# Patient Record
Sex: Male | Born: 1965 | Race: White | Hispanic: No | Marital: Married
Health system: Southern US, Community
[De-identification: ages and names within clinical notes are randomized; demographics above are authoritative.]

## PROBLEM LIST (undated history)

## (undated) DIAGNOSIS — F1910 Other psychoactive substance abuse, uncomplicated: Secondary | ICD-10-CM

## (undated) DIAGNOSIS — K766 Portal hypertension: Secondary | ICD-10-CM

## (undated) DIAGNOSIS — F1010 Alcohol abuse, uncomplicated: Secondary | ICD-10-CM

## (undated) DIAGNOSIS — K7460 Unspecified cirrhosis of liver: Secondary | ICD-10-CM

---

## 2014-06-07 ENCOUNTER — Inpatient Hospital Stay
Admission: AD | Admit: 2014-06-07 | Discharge: 2014-07-21 | Disposition: A | Discharge status: Skilled Nursing Facility | Payer: Medicare Other | Source: Ambulatory Visit | Attending: Internal Medicine | Admitting: Internal Medicine

## 2014-06-07 DIAGNOSIS — R4189 Other symptoms and signs involving cognitive functions and awareness: Secondary | ICD-10-CM

## 2014-06-07 DIAGNOSIS — J9601 Acute respiratory failure with hypoxia: Secondary | ICD-10-CM | POA: Insufficient documentation

## 2014-06-07 DIAGNOSIS — H6990 Unspecified Eustachian tube disorder, unspecified ear: Secondary | ICD-10-CM

## 2014-06-07 DIAGNOSIS — I639 Cerebral infarction, unspecified: Secondary | ICD-10-CM | POA: Insufficient documentation

## 2014-06-07 DIAGNOSIS — G929 Unspecified toxic encephalopathy: Secondary | ICD-10-CM

## 2014-06-07 DIAGNOSIS — K7460 Unspecified cirrhosis of liver: Secondary | ICD-10-CM | POA: Insufficient documentation

## 2014-06-07 DIAGNOSIS — F1910 Other psychoactive substance abuse, uncomplicated: Secondary | ICD-10-CM

## 2014-06-07 DIAGNOSIS — G92 Toxic encephalopathy: Secondary | ICD-10-CM

## 2014-06-07 DIAGNOSIS — J189 Pneumonia, unspecified organism: Secondary | ICD-10-CM

## 2014-06-07 DIAGNOSIS — J9690 Respiratory failure, unspecified, unspecified whether with hypoxia or hypercapnia: Secondary | ICD-10-CM

## 2014-06-07 DIAGNOSIS — Z95828 Presence of other vascular implants and grafts: Secondary | ICD-10-CM

## 2014-06-07 DIAGNOSIS — Z789 Other specified health status: Secondary | ICD-10-CM

## 2014-06-07 DIAGNOSIS — Z931 Gastrostomy status: Secondary | ICD-10-CM

## 2014-06-07 DIAGNOSIS — Z930 Tracheostomy status: Secondary | ICD-10-CM

## 2014-06-07 HISTORY — DX: Portal hypertension: K76.6

## 2014-06-07 HISTORY — DX: Alcohol abuse, uncomplicated: F10.10

## 2014-06-07 HISTORY — DX: Other psychoactive substance abuse, uncomplicated: F19.10

## 2014-06-07 HISTORY — DX: Unspecified cirrhosis of liver: K74.60

## 2014-06-07 LAB — CBC WITH DIFFERENTIAL/PLATELET
BASOS ABS: 0.1 10*3/uL (ref 0.0–0.1)
Basophils Relative: 1 % (ref 0–1)
Eosinophils Absolute: 0.2 10*3/uL (ref 0.0–0.7)
Eosinophils Relative: 2 % (ref 0–5)
HEMATOCRIT: 37.1 % — AB (ref 39.0–52.0)
Hemoglobin: 12.3 g/dL — ABNORMAL LOW (ref 13.0–17.0)
Lymphocytes Relative: 22 % (ref 12–46)
Lymphs Abs: 2.4 10*3/uL (ref 0.7–4.0)
MCH: 31.9 pg (ref 26.0–34.0)
MCHC: 33.2 g/dL (ref 30.0–36.0)
MCV: 96.4 fL (ref 78.0–100.0)
MONOS PCT: 14 % — AB (ref 3–12)
Monocytes Absolute: 1.5 10*3/uL — ABNORMAL HIGH (ref 0.1–1.0)
NEUTROS ABS: 6.7 10*3/uL (ref 1.7–7.7)
Neutrophils Relative %: 61 % (ref 43–77)
PLATELETS: ADEQUATE 10*3/uL (ref 150–400)
RBC: 3.85 MIL/uL — ABNORMAL LOW (ref 4.22–5.81)
RDW: 13.8 % (ref 11.5–15.5)
WBC: 10.9 10*3/uL — AB (ref 4.0–10.5)

## 2014-06-07 LAB — PHOSPHORUS: PHOSPHORUS: 2.9 mg/dL (ref 2.3–4.6)

## 2014-06-07 LAB — BASIC METABOLIC PANEL
Anion gap: 13 (ref 5–15)
BUN: 7 mg/dL (ref 6–23)
CO2: 23 mEq/L (ref 19–32)
CREATININE: 0.65 mg/dL (ref 0.50–1.35)
Calcium: 9.1 mg/dL (ref 8.4–10.5)
Chloride: 105 mEq/L (ref 96–112)
GLUCOSE: 104 mg/dL — AB (ref 70–99)
POTASSIUM: 3.4 meq/L — AB (ref 3.7–5.3)
Sodium: 141 mEq/L (ref 137–147)

## 2014-06-07 LAB — MAGNESIUM: Magnesium: 1.3 mg/dL — ABNORMAL LOW (ref 1.5–2.5)

## 2014-06-08 ENCOUNTER — Other Ambulatory Visit (HOSPITAL_COMMUNITY): Payer: Self-pay

## 2014-06-08 LAB — VITAMIN B12: VITAMIN B 12: 1069 pg/mL — AB (ref 211–911)

## 2014-06-08 LAB — COMPREHENSIVE METABOLIC PANEL
ALBUMIN: 3.2 g/dL — AB (ref 3.5–5.2)
ALK PHOS: 115 U/L (ref 39–117)
ALT: 15 U/L (ref 0–53)
AST: 34 U/L (ref 0–37)
Anion gap: 14 (ref 5–15)
BILIRUBIN TOTAL: 1.1 mg/dL (ref 0.3–1.2)
BUN: 5 mg/dL — ABNORMAL LOW (ref 6–23)
CO2: 19 mEq/L (ref 19–32)
Calcium: 8.9 mg/dL (ref 8.4–10.5)
Chloride: 104 mEq/L (ref 96–112)
Creatinine, Ser: 0.66 mg/dL (ref 0.50–1.35)
GFR calc Af Amer: >90 mL/min (ref >90)
GFR calc non Af Amer: >90 mL/min (ref >90)
Glucose, Bld: 105 mg/dL — ABNORMAL HIGH (ref 70–99)
POTASSIUM: 3.7 meq/L (ref 3.7–5.3)
Sodium: 137 mEq/L (ref 137–147)
TOTAL PROTEIN: 6.7 g/dL (ref 6.0–8.3)

## 2014-06-08 LAB — TSH: TSH: 1.66 u[IU]/mL (ref 0.350–4.500)

## 2014-06-08 LAB — APTT: aPTT: 31 seconds (ref 24–37)

## 2014-06-08 LAB — PROTIME-INR
INR: 1.19 (ref 0.00–1.49)
PROTHROMBIN TIME: 15.2 s (ref 11.6–15.2)

## 2014-06-08 LAB — GAMMA GT: GGT: 544 U/L — ABNORMAL HIGH (ref 7–51)

## 2014-06-08 LAB — T4, FREE: FREE T4: 1.39 ng/dL (ref 0.80–1.80)

## 2014-06-08 LAB — MAGNESIUM: Magnesium: 2.4 mg/dL (ref 1.5–2.5)

## 2014-06-08 LAB — PHOSPHORUS: Phosphorus: 2.4 mg/dL (ref 2.3–4.6)

## 2014-06-09 ENCOUNTER — Other Ambulatory Visit (HOSPITAL_COMMUNITY): Payer: Self-pay

## 2014-06-09 LAB — BASIC METABOLIC PANEL
ANION GAP: 14 (ref 5–15)
BUN: 6 mg/dL (ref 6–23)
CALCIUM: 8.5 mg/dL (ref 8.4–10.5)
CO2: 20 meq/L (ref 19–32)
CREATININE: 0.71 mg/dL (ref 0.50–1.35)
Chloride: 105 mEq/L (ref 96–112)
GFR calc Af Amer: >90 mL/min (ref >90)
GFR calc non Af Amer: >90 mL/min (ref >90)
Glucose, Bld: 98 mg/dL (ref 70–99)
Potassium: 3.3 mEq/L — ABNORMAL LOW (ref 3.7–5.3)
Sodium: 139 mEq/L (ref 137–147)

## 2014-06-09 LAB — CBC
HEMATOCRIT: 32.1 % — AB (ref 39.0–52.0)
Hemoglobin: 10.4 g/dL — ABNORMAL LOW (ref 13.0–17.0)
MCH: 30.7 pg (ref 26.0–34.0)
MCHC: 32.4 g/dL (ref 30.0–36.0)
MCV: 94.7 fL (ref 78.0–100.0)
PLATELETS: 215 10*3/uL (ref 150–400)
RBC: 3.39 MIL/uL — AB (ref 4.22–5.81)
RDW: 13.4 % (ref 11.5–15.5)
WBC: 8.5 10*3/uL (ref 4.0–10.5)

## 2014-06-09 LAB — PHOSPHORUS: Phosphorus: 2.4 mg/dL (ref 2.3–4.6)

## 2014-06-09 LAB — MAGNESIUM: Magnesium: 1.3 mg/dL — ABNORMAL LOW (ref 1.5–2.5)

## 2014-06-09 LAB — FOLATE RBC: RBC Folate: 838 ng/mL — ABNORMAL HIGH (ref >280)

## 2014-06-10 LAB — MAGNESIUM
MAGNESIUM: 1.5 mg/dL (ref 1.5–2.5)
Magnesium: 1.9 mg/dL (ref 1.5–2.5)

## 2014-06-10 LAB — POTASSIUM: POTASSIUM: 4.4 meq/L (ref 3.7–5.3)

## 2014-06-10 LAB — CBC
HEMATOCRIT: 28.3 % — AB (ref 39.0–52.0)
HEMOGLOBIN: 9.3 g/dL — AB (ref 13.0–17.0)
MCH: 31.1 pg (ref 26.0–34.0)
MCHC: 32.9 g/dL (ref 30.0–36.0)
MCV: 94.6 fL (ref 78.0–100.0)
Platelets: 204 10*3/uL (ref 150–400)
RBC: 2.99 MIL/uL — AB (ref 4.22–5.81)
RDW: 13.4 % (ref 11.5–15.5)
WBC: 7.9 10*3/uL (ref 4.0–10.5)

## 2014-06-10 LAB — BASIC METABOLIC PANEL
Anion gap: 15 (ref 5–15)
BUN: 3 mg/dL — AB (ref 6–23)
CHLORIDE: 103 meq/L (ref 96–112)
CO2: 19 meq/L (ref 19–32)
Calcium: 8 mg/dL — ABNORMAL LOW (ref 8.4–10.5)
Creatinine, Ser: 0.58 mg/dL (ref 0.50–1.35)
GFR calc Af Amer: >90 mL/min (ref >90)
GFR calc non Af Amer: >90 mL/min (ref >90)
Glucose, Bld: 201 mg/dL — ABNORMAL HIGH (ref 70–99)
POTASSIUM: 4.1 meq/L (ref 3.7–5.3)
SODIUM: 137 meq/L (ref 137–147)

## 2014-06-10 LAB — PHOSPHORUS: PHOSPHORUS: 2.1 mg/dL — AB (ref 2.3–4.6)

## 2014-06-11 LAB — URINALYSIS, ROUTINE W REFLEX MICROSCOPIC
BILIRUBIN URINE: NEGATIVE
Glucose, UA: 250 mg/dL — AB
Hgb urine dipstick: NEGATIVE
KETONES UR: NEGATIVE mg/dL
LEUKOCYTES UA: NEGATIVE
Nitrite: POSITIVE — AB
PH: 6.5 (ref 5.0–8.0)
Protein, ur: NEGATIVE mg/dL
SPECIFIC GRAVITY, URINE: 1.018 (ref 1.005–1.030)
UROBILINOGEN UA: 0.2 mg/dL (ref 0.0–1.0)

## 2014-06-11 LAB — BASIC METABOLIC PANEL
Anion gap: 13 (ref 5–15)
BUN: 32 mg/dL — AB (ref 6–23)
CHLORIDE: 102 meq/L (ref 96–112)
CO2: 24 mEq/L (ref 19–32)
Calcium: 8.7 mg/dL (ref 8.4–10.5)
Creatinine, Ser: 1.03 mg/dL (ref 0.50–1.35)
GFR, EST NON AFRICAN AMERICAN: 83 mL/min — AB (ref >90)
GLUCOSE: 100 mg/dL — AB (ref 70–99)
Potassium: 4.4 mEq/L (ref 3.7–5.3)
SODIUM: 139 meq/L (ref 137–147)

## 2014-06-11 LAB — CBC
HEMATOCRIT: 28 % — AB (ref 39.0–52.0)
HEMOGLOBIN: 8.9 g/dL — AB (ref 13.0–17.0)
MCH: 29 pg (ref 26.0–34.0)
MCHC: 31.8 g/dL (ref 30.0–36.0)
MCV: 91.2 fL (ref 78.0–100.0)
Platelets: 200 10*3/uL (ref 150–400)
RBC: 3.07 MIL/uL — ABNORMAL LOW (ref 4.22–5.81)
RDW: 19.2 % — ABNORMAL HIGH (ref 11.5–15.5)
WBC: 11.4 10*3/uL — AB (ref 4.0–10.5)

## 2014-06-11 LAB — MAGNESIUM: Magnesium: 1.9 mg/dL (ref 1.5–2.5)

## 2014-06-11 LAB — URINE MICROSCOPIC-ADD ON

## 2014-06-11 LAB — PHOSPHORUS: PHOSPHORUS: 3.6 mg/dL (ref 2.3–4.6)

## 2014-06-12 ENCOUNTER — Other Ambulatory Visit (HOSPITAL_COMMUNITY): Payer: Self-pay

## 2014-06-12 LAB — BASIC METABOLIC PANEL
ANION GAP: 14 (ref 5–15)
BUN: 10 mg/dL (ref 6–23)
CALCIUM: 8.7 mg/dL (ref 8.4–10.5)
CO2: 23 mEq/L (ref 19–32)
CREATININE: 0.68 mg/dL (ref 0.50–1.35)
Chloride: 105 mEq/L (ref 96–112)
Glucose, Bld: 115 mg/dL — ABNORMAL HIGH (ref 70–99)
Potassium: 3.5 mEq/L — ABNORMAL LOW (ref 3.7–5.3)
Sodium: 142 mEq/L (ref 137–147)

## 2014-06-12 LAB — CBC WITH DIFFERENTIAL/PLATELET
BASOS PCT: 1 % (ref 0–1)
Basophils Absolute: 0 10*3/uL (ref 0.0–0.1)
Eosinophils Absolute: 0.1 10*3/uL (ref 0.0–0.7)
Eosinophils Relative: 1 % (ref 0–5)
HEMATOCRIT: 34.4 % — AB (ref 39.0–52.0)
HEMOGLOBIN: 11.2 g/dL — AB (ref 13.0–17.0)
LYMPHS ABS: 0.8 10*3/uL (ref 0.7–4.0)
Lymphocytes Relative: 12 % (ref 12–46)
MCH: 31.7 pg (ref 26.0–34.0)
MCHC: 32.6 g/dL (ref 30.0–36.0)
MCV: 97.5 fL (ref 78.0–100.0)
MONO ABS: 0.6 10*3/uL (ref 0.1–1.0)
Monocytes Relative: 8 % (ref 3–12)
Neutro Abs: 5.4 10*3/uL (ref 1.7–7.7)
Neutrophils Relative %: 79 % — ABNORMAL HIGH (ref 43–77)
Platelets: 217 10*3/uL (ref 150–400)
RBC: 3.53 MIL/uL — AB (ref 4.22–5.81)
RDW: 13.5 % (ref 11.5–15.5)
WBC: 6.9 10*3/uL (ref 4.0–10.5)

## 2014-06-12 LAB — URINE CULTURE
CULTURE: NO GROWTH
Colony Count: NO GROWTH

## 2014-06-12 LAB — PHOSPHORUS: PHOSPHORUS: 2.6 mg/dL (ref 2.3–4.6)

## 2014-06-12 LAB — MAGNESIUM: Magnesium: 1.6 mg/dL (ref 1.5–2.5)

## 2014-06-13 LAB — BASIC METABOLIC PANEL
Anion gap: 14 (ref 5–15)
BUN: 12 mg/dL (ref 6–23)
CHLORIDE: 105 meq/L (ref 96–112)
CO2: 22 meq/L (ref 19–32)
Calcium: 8.7 mg/dL (ref 8.4–10.5)
Creatinine, Ser: 0.67 mg/dL (ref 0.50–1.35)
GFR calc Af Amer: >90 mL/min (ref >90)
GFR calc non Af Amer: >90 mL/min (ref >90)
GLUCOSE: 129 mg/dL — AB (ref 70–99)
POTASSIUM: 3.6 meq/L — AB (ref 3.7–5.3)
Sodium: 141 mEq/L (ref 137–147)

## 2014-06-13 LAB — PHOSPHORUS: Phosphorus: 2.7 mg/dL (ref 2.3–4.6)

## 2014-06-13 LAB — CBC
HEMATOCRIT: 32.9 % — AB (ref 39.0–52.0)
HEMOGLOBIN: 10.6 g/dL — AB (ref 13.0–17.0)
MCH: 30.7 pg (ref 26.0–34.0)
MCHC: 32.2 g/dL (ref 30.0–36.0)
MCV: 95.4 fL (ref 78.0–100.0)
Platelets: 231 10*3/uL (ref 150–400)
RBC: 3.45 MIL/uL — AB (ref 4.22–5.81)
RDW: 13.7 % (ref 11.5–15.5)
WBC: 7.2 10*3/uL (ref 4.0–10.5)

## 2014-06-13 LAB — MAGNESIUM: MAGNESIUM: 1.6 mg/dL (ref 1.5–2.5)

## 2014-06-14 ENCOUNTER — Other Ambulatory Visit (HOSPITAL_COMMUNITY): Payer: Self-pay

## 2014-06-14 LAB — CBC WITH DIFFERENTIAL/PLATELET
BASOS ABS: 0.1 10*3/uL (ref 0.0–0.1)
Basophils Relative: 1 % (ref 0–1)
EOS PCT: 0 % (ref 0–5)
Eosinophils Absolute: 0 10*3/uL (ref 0.0–0.7)
HEMATOCRIT: 34.8 % — AB (ref 39.0–52.0)
Hemoglobin: 11.1 g/dL — ABNORMAL LOW (ref 13.0–17.0)
LYMPHS ABS: 0.9 10*3/uL (ref 0.7–4.0)
LYMPHS PCT: 10 % — AB (ref 12–46)
MCH: 30.7 pg (ref 26.0–34.0)
MCHC: 31.9 g/dL (ref 30.0–36.0)
MCV: 96.4 fL (ref 78.0–100.0)
MONO ABS: 1.4 10*3/uL — AB (ref 0.1–1.0)
Monocytes Relative: 14 % — ABNORMAL HIGH (ref 3–12)
NEUTROS ABS: 7.3 10*3/uL (ref 1.7–7.7)
Neutrophils Relative %: 75 % (ref 43–77)
Platelets: 234 10*3/uL (ref 150–400)
RBC: 3.61 MIL/uL — ABNORMAL LOW (ref 4.22–5.81)
RDW: 13.8 % (ref 11.5–15.5)
WBC: 9.8 10*3/uL (ref 4.0–10.5)

## 2014-06-14 LAB — BASIC METABOLIC PANEL
Anion gap: 11 (ref 5–15)
BUN: 9 mg/dL (ref 6–23)
CHLORIDE: 105 meq/L (ref 96–112)
CO2: 24 mEq/L (ref 19–32)
Calcium: 8.8 mg/dL (ref 8.4–10.5)
Creatinine, Ser: 0.6 mg/dL (ref 0.50–1.35)
GFR calc Af Amer: >90 mL/min (ref >90)
GFR calc non Af Amer: >90 mL/min (ref >90)
GLUCOSE: 134 mg/dL — AB (ref 70–99)
POTASSIUM: 3.8 meq/L (ref 3.7–5.3)
Sodium: 140 mEq/L (ref 137–147)

## 2014-06-14 LAB — PHOSPHORUS: PHOSPHORUS: 3.8 mg/dL (ref 2.3–4.6)

## 2014-06-14 LAB — MAGNESIUM: Magnesium: 1.7 mg/dL (ref 1.5–2.5)

## 2014-06-15 ENCOUNTER — Other Ambulatory Visit: Payer: Self-pay

## 2014-06-15 ENCOUNTER — Inpatient Hospital Stay (HOSPITAL_COMMUNITY)
Admission: AD | Admit: 2014-06-15 | Discharge: 2014-06-15 | Disposition: A | Discharge status: Home / Self Care | Payer: Self-pay | Source: Ambulatory Visit | Attending: Internal Medicine | Admitting: Internal Medicine

## 2014-06-15 ENCOUNTER — Other Ambulatory Visit (HOSPITAL_COMMUNITY): Payer: Self-pay

## 2014-06-15 ENCOUNTER — Encounter: Payer: Self-pay | Admitting: Pulmonary Disease

## 2014-06-15 DIAGNOSIS — J9600 Acute respiratory failure, unspecified whether with hypoxia or hypercapnia: Secondary | ICD-10-CM | POA: Diagnosis not present

## 2014-06-15 DIAGNOSIS — R40243 Glasgow coma scale score 3-8: Secondary | ICD-10-CM | POA: Diagnosis not present

## 2014-06-15 DIAGNOSIS — I469 Cardiac arrest, cause unspecified: Secondary | ICD-10-CM

## 2014-06-15 LAB — COMPREHENSIVE METABOLIC PANEL
ALT: 17 U/L (ref 0–53)
ANION GAP: 13 (ref 5–15)
AST: 44 U/L — ABNORMAL HIGH (ref 0–37)
Albumin: 2.6 g/dL — ABNORMAL LOW (ref 3.5–5.2)
Alkaline Phosphatase: 205 U/L — ABNORMAL HIGH (ref 39–117)
BUN: 23 mg/dL (ref 6–23)
CALCIUM: 8.6 mg/dL (ref 8.4–10.5)
CO2: 24 mEq/L (ref 19–32)
CREATININE: 0.86 mg/dL (ref 0.50–1.35)
Chloride: 100 mEq/L (ref 96–112)
GLUCOSE: 130 mg/dL — AB (ref 70–99)
Potassium: 3.7 mEq/L (ref 3.7–5.3)
Sodium: 137 mEq/L (ref 137–147)
TOTAL PROTEIN: 6.4 g/dL (ref 6.0–8.3)
Total Bilirubin: 0.5 mg/dL (ref 0.3–1.2)

## 2014-06-15 LAB — BLOOD GAS, ARTERIAL
Acid-base deficit: 3.6 mmol/L — ABNORMAL HIGH (ref 0.0–2.0)
Bicarbonate: 21.9 mEq/L (ref 20.0–24.0)
FIO2: 1 %
O2 Saturation: 98.3 %
PCO2 ART: 47 mmHg — AB (ref 35.0–45.0)
PEEP: 5 cmH2O
PH ART: 7.291 — AB (ref 7.350–7.450)
PO2 ART: 126 mmHg — AB (ref 80.0–100.0)
Patient temperature: 99.1
RATE: 14 resp/min
TCO2: 23.3 mmol/L (ref 0–100)
VT: 500 mL

## 2014-06-15 LAB — CBC WITH DIFFERENTIAL/PLATELET
Basophils Absolute: 0 10*3/uL (ref 0.0–0.1)
Basophils Relative: 0 % (ref 0–1)
EOS ABS: 0 10*3/uL (ref 0.0–0.7)
EOS PCT: 0 % (ref 0–5)
HCT: 33.7 % — ABNORMAL LOW (ref 39.0–52.0)
Hemoglobin: 10.9 g/dL — ABNORMAL LOW (ref 13.0–17.0)
Lymphocytes Relative: 8 % — ABNORMAL LOW (ref 12–46)
Lymphs Abs: 0.8 10*3/uL (ref 0.7–4.0)
MCH: 31.7 pg (ref 26.0–34.0)
MCHC: 32.3 g/dL (ref 30.0–36.0)
MCV: 98 fL (ref 78.0–100.0)
MONOS PCT: 12 % (ref 3–12)
Monocytes Absolute: 1.2 10*3/uL — ABNORMAL HIGH (ref 0.1–1.0)
Neutro Abs: 8.1 10*3/uL — ABNORMAL HIGH (ref 1.7–7.7)
Neutrophils Relative %: 80 % — ABNORMAL HIGH (ref 43–77)
Platelets: 184 10*3/uL (ref 150–400)
RBC: 3.44 MIL/uL — ABNORMAL LOW (ref 4.22–5.81)
RDW: 13.6 % (ref 11.5–15.5)
WBC: 10.1 10*3/uL (ref 4.0–10.5)

## 2014-06-15 LAB — PHOSPHORUS: PHOSPHORUS: 3 mg/dL (ref 2.3–4.6)

## 2014-06-15 LAB — MAGNESIUM: MAGNESIUM: 1.9 mg/dL (ref 1.5–2.5)

## 2014-06-15 LAB — TROPONIN I: Troponin I: <0.3 ng/mL (ref <0.30)

## 2014-06-15 MED FILL — Medication: Qty: 1 | Status: AC

## 2014-06-15 NOTE — Consult Note (Signed)
Name: Paul Scott MRN: [id number] DOB: 03/06/1966    ADMISSION DATE:  06/07/2014 CONSULTATION DATE:  06/15/14  REFERRING MD :  Dr. Sharyon MedicusHijazi   CHIEF COMPLAINT:  Cardiopulmonary arrest  BRIEF PATIENT DESCRIPTION: 48 y/o M with PMH of polysubstance abuse   SIGNIFICANT EVENTS  11/23  [facility] with [facility], intubated 12/02  [facility], on phenobarbitol  12/10  Bradycardic arrest, intubated   STUDIES:  12/10  CT Head >>     HISTORY OF PRESENT ILLNESS: 48 y/o M with PMH of polysubstance abuse [facility] after being found unresponsive in his yard.  He suffered a PEA arrest in the ER and was admitted to the ICU.  Arrest was thought to be related to substance abuse.  Hospital work up at that time was positive for cirrhosis with portal hypertension and ascites.  He underwent paracentesis with 4L of fluid removed.  The patient was extubated and moved to SDU where he developed DT's and severe encephalopathy.  He was treated with IV benzo's which did not improve patient symptoms.  He was evaluated by psychiatry and placed on phenobarbitol.  Hospital course complicated by tachycardia, hypertension, and c-diff colitis (dx on 06/01/14.  He was treated with Flagyl (planned stop date 06/15/14).  Per notes, the patient was awake, alert and able to take pills.  [facility] [place] [facility].  On 12/10, the patient suffered an bradycardic arrest and was intubated.  PCCM consulted for evaluation.  [facility], the patient has not been awake / oriented since admission.     PAST MEDICAL HISTORY :   has a past medical history of Alcohol abuse; Drug abuse; Cirrhosis; and Portal hypertension.  has no past surgical history on file.    PRIOR MEDICATIONS: Vancomycin oral  Flagyl  Metoprolol Duoneb Ativan 10 mg Q2 PRN seizure Levaquin  Clonidine Folic Acid  Haldol  Hydralazine  Thiamine  Sucralfate Pepcid    No Known  Allergies  FAMILY HISTORY:  family history is not on file.   SOCIAL HISTORY: unable to complete as patient is altered on vent  REVIEW OF SYSTEMS:  Unable to complete as patient is altered on vent.    SUBJECTIVE:   VITAL SIGNS:  Reviewed, pertinent positives will be reviewed in A/P   PHYSICAL EXAMINATION: General:  Thin adult male in NAD Neuro:  Obtunded on vent  HEENT:  OETT, thick oral secretions  Cardiovascular:  s1s2 rrr, no m/r/g  Lungs:  resp's even/non-labored, lungs bilaterally diminished  Abdomen:  Round/soft, bsx4 active  Musculoskeletal:  No acute deformities  Skin:  Warm/dry, multiple tattoos   Recent Labs Lab 06/12/14 0500 06/13/14 0635 06/14/14 0545  NA 142 141 140  K 3.5* 3.6* 3.8  CL 105 105 105  CO2 23 22 24   BUN 10 12 9   CREATININE 0.68 0.67 0.60  GLUCOSE 115* 129* 134*    Recent Labs Lab 06/12/14 0745 06/13/14 0635 06/14/14 0545  HGB 11.2* 10.6* 11.1*  HCT 34.4* 32.9* 34.8*  WBC 6.9 7.2 9.8  PLT 217 231 234   Dg Chest Port 1 View  06/15/2014   CLINICAL DATA:  Code blue. Endotracheal tube placement. Unresponsive.  EXAM: PORTABLE CHEST - 1 VIEW  COMPARISON:  06/14/2014  FINDINGS: Endotracheal tube tip measures 6.6 cm above the carinal. Enteric tube tip is off the field of view but below the left hemidiaphragm right central venous catheter tip over  the lower SVC region. No pneumothorax. Shallow inspiration. Heart size and pulmonary vascularity are normal. No focal airspace disease or consolidation in the lungs. No pneumothorax. Probable fracture of the anterior right second rib.  IMPRESSION: Appliances appear in satisfactory position. Lungs are clear. Probable right second anterior rib fracture.   Electronically Signed   By: Burman NievesWilliam  Stevens M.D.   On: 06/15/2014 06:56   Dg Chest Port 1 View  06/14/2014   CLINICAL DATA:  History pneumonia  EXAM: PORTABLE CHEST - 1 VIEW  COMPARISON:  Chest x-ray of 06/12/2014  FINDINGS: There is mild atelectasis  in the lung bases but no definite infiltrate or pleural effusion is seen. Linear atelectasis is noted more prominently at the right lung base. A right central venous line tip overlies the expected SVC -RA junction. An NG tube is present extending into the stomach. The heart size is within normal limits.  IMPRESSION: Bibasilar atelectasis.  No definite pneumonia or effusion.   Electronically Signed   By: Dwyane DeePaul  Barry M.D.   On: 06/14/2014 14:34    ASSESSMENT / PLAN:   Respiratory Arrest - bradycardia then arrest on 12/10, required intubation  R 2nd Rib Fracture - post CPR  Polysubstance Abuse  Encephalopathy C-Diff Colitis  Cirrhosis with Portal Hypertension & Ascites   Plan: Duoneb Q6 + Q3 PRN albuterol  IMV support, adjust rate, follow up ABG in am  Sputum culture  ABX per primary  CT head to evaluate for acute process  Recommend slow reduction / taper of sedating medications to allow for neuro assessment    Canary BrimBrandi Ollis, NP-C [facility] Pgr: [phone] or [phone]  Recommend discontinuation of all sedating medications except for 1/2 of the phenobarb, start propofol, if mental status remains poor by AM then will [facility] neuro to evaluate.  In the meantime, check EEG and CT of the head.  Low threshold for transfer.  In the meantime, support vital signs and will reevaluate again in AM.  The patient is critically ill with multiple organ systems failure and requires high complexity decision making for assessment and support, frequent evaluation and titration of therapies, application of advanced monitoring technologies and extensive interpretation of multiple databases.   Critical Care Time devoted to patient care services described in this note is  35  Minutes. This time reflects time of care of this signee Dr Koren BoundWesam Saavi Mceachron. This critical care time does not reflect procedure time, or teaching time or supervisory time of PA/NP/Med student/Med  Resident etc but could involve care discussion time.  Alyson ReedyWesam G. Lerline Valdivia, M.D. [facility]. Pager: [phone]. After hours pager: [phone].  06/15/2014, 1:04 PM

## 2014-06-15 NOTE — Progress Notes (Signed)
EEG completed; results pending.    

## 2014-06-15 NOTE — Procedures (Signed)
EEG report.  Brief clinical history:  48 y/o M with PMH of polysubstance abuse [facility] after being found unresponsive in his yard. He suffered a PEA arrest in the ER and was admitted to the ICU. Arrest was thought to be related to substance abuse. On 12/10, the patient suffered an bradycardic arrest and was intubated. PCCM consulted for evaluation. [facility], the patient has not been awake / oriented since admission.   Technique: this is a 17 channel routine scalp EEG performed at the bedside with bipolar and monopolar montages arranged in accordance to the international 10/20 system of electrode placement. One channel was dedicated to EKG recording.  Patient is intubated on the vent. No activating procedures performed.  Description: as the study begins and throughout the entire recording, there is evidence of continuous and generalized alpha rhythm that doesn't appears to be posterior dominant is unreactive. No focal or generalized epileptiform discharges noted.  EKG showed sinus rhythm.   Impression: this is an abnormal EEG because of the above described findings. The overall EEG pattern seems to be consistent with alpha coma, which can be seen in a variety of conditions including post anoxic brain injury. Although typically associated with poor prognosis, it can be reversible particularly if medication or drug related. Clinical correlation is advised.   Wyatt Portelasvaldo Camilo, MD [facility]

## 2014-06-16 ENCOUNTER — Other Ambulatory Visit (HOSPITAL_COMMUNITY): Payer: Self-pay

## 2014-06-16 DIAGNOSIS — J9621 Acute and chronic respiratory failure with hypoxia: Secondary | ICD-10-CM

## 2014-06-16 DIAGNOSIS — R40244 Other coma, without documented Glasgow coma scale score, or with partial score reported: Secondary | ICD-10-CM | POA: Diagnosis not present

## 2014-06-16 DIAGNOSIS — I469 Cardiac arrest, cause unspecified: Secondary | ICD-10-CM | POA: Diagnosis not present

## 2014-06-16 LAB — CBC
HCT: 32.6 % — ABNORMAL LOW (ref 39.0–52.0)
Hemoglobin: 10.4 g/dL — ABNORMAL LOW (ref 13.0–17.0)
MCH: 31.4 pg (ref 26.0–34.0)
MCHC: 31.9 g/dL (ref 30.0–36.0)
MCV: 98.5 fL (ref 78.0–100.0)
Platelets: 181 10*3/uL (ref 150–400)
RBC: 3.31 MIL/uL — ABNORMAL LOW (ref 4.22–5.81)
RDW: 13.7 % (ref 11.5–15.5)
WBC: 8.9 10*3/uL (ref 4.0–10.5)

## 2014-06-16 LAB — BLOOD GAS, ARTERIAL
Acid-Base Excess: 1.9 mmol/L (ref 0.0–2.0)
BICARBONATE: 25 meq/L — AB (ref 20.0–24.0)
FIO2: 0.4 %
MECHVT: 500 mL
O2 Saturation: 98.3 %
PCO2 ART: 32.7 mmHg — AB (ref 35.0–45.0)
PEEP: 5 cmH2O
PH ART: 7.496 — AB (ref 7.350–7.450)
Patient temperature: 98.6
RATE: 14 resp/min
TCO2: 26 mmol/L (ref 0–100)
pO2, Arterial: 114 mmHg — ABNORMAL HIGH (ref 80.0–100.0)

## 2014-06-16 LAB — BASIC METABOLIC PANEL
Anion gap: 12 (ref 5–15)
BUN: 24 mg/dL — ABNORMAL HIGH (ref 6–23)
CO2: 25 mEq/L (ref 19–32)
Calcium: 8.7 mg/dL (ref 8.4–10.5)
Chloride: 104 mEq/L (ref 96–112)
Creatinine, Ser: 0.76 mg/dL (ref 0.50–1.35)
GFR calc Af Amer: >90 mL/min (ref >90)
Glucose, Bld: 114 mg/dL — ABNORMAL HIGH (ref 70–99)
Potassium: 4.1 mEq/L (ref 3.7–5.3)
Sodium: 141 mEq/L (ref 137–147)

## 2014-06-16 LAB — MAGNESIUM: MAGNESIUM: 2.1 mg/dL (ref 1.5–2.5)

## 2014-06-16 LAB — LACTIC ACID, PLASMA: LACTIC ACID, VENOUS: 2.1 mmol/L (ref 0.5–2.2)

## 2014-06-16 NOTE — Progress Notes (Signed)
Name: Paul Scott MRN: [id number] DOB: 03/14/1966    ADMISSION DATE:  06/07/2014 CONSULTATION DATE:  06/15/14  REFERRING MD :  Dr. Sharyon MedicusHijazi   CHIEF COMPLAINT:  Cardiopulmonary arrest  BRIEF PATIENT DESCRIPTION: 48 y/o M with PMH of polysubstance abuse   SIGNIFICANT EVENTS  11/23  [facility], intubated 12/02  [facility], on phenobarbitol  12/10  Bradycardic arrest, intubated   STUDIES:  12/10  CT Head >> neg    HISTORY OF PRESENT ILLNESS: 48 y/o M with PMH of polysubstance abuse previously admitted to [facility] 05/29/14 after being found unresponsive in his yard.  He suffered a PEA arrest in the ER and was admitted to the ICU.  Arrest was thought to be related to substance abuse.  Hospital work up at that time was positive for cirrhosis with portal hypertension and ascites.  He underwent paracentesis with 4L of fluid removed.  The patient was extubated and moved to SDU where he developed DT's and severe encephalopathy.  He was treated with IV benzo's which did not improve patient symptoms.  He was evaluated by psychiatry and placed on phenobarbitol.  Hospital course complicated by tachycardia, hypertension, and c-diff colitis (dx on 06/01/14.  He was treated with Flagyl (planned stop date 06/15/14).  Per notes, the patient was awake, alert and able to take pills.  [facility] [place] [facility].  On 12/10, the patient suffered an bradycardic arrest and was intubated.  PCCM consulted for evaluation.  Per [facility] report, the patient has not been awake / oriented since admission.        SUBJECTIVE:   VITAL SIGNS:  Reviewed, pertinent positives will be reviewed in A/P   PHYSICAL EXAMINATION: General:  Thin adult male in NAD on vent Neuro: follows commandson vent  HEENT:  OETT, thick oral secretions  Cardiovascular:  s1s2 rrr, no m/r/g  Lungs:  resp's even/non-labored, lungs bilaterally diminished  Abdomen:  Round/soft, bsx4 active   Musculoskeletal:  No acute deformities  Skin:  Warm/dry, multiple tattoos   Recent Labs Lab 06/14/14 0545 06/15/14 1110 06/16/14 0500  NA 140 137 141  K 3.8 3.7 4.1  CL 105 100 104  CO2 24 24 25   BUN 9 23 24*  CREATININE 0.60 0.86 0.76  GLUCOSE 134* 130* 114*    Recent Labs Lab 06/14/14 0545 06/15/14 1110 06/16/14 0500  HGB 11.1* 10.9* 10.4*  HCT 34.8* 33.7* 32.6*  WBC 9.8 10.1 8.9  PLT 234 184 181   Ct Head Wo Contrast  06/16/2014   CLINICAL DATA:  Cardiac arrest today.  Patient is intubated.  EXAM: CT HEAD WITHOUT CONTRAST  TECHNIQUE: Contiguous axial images were obtained from the base of the skull through the vertex without intravenous contrast.  COMPARISON:  06/05/2014  FINDINGS: Diffuse cerebral atrophy. Low-attenuation changes in the deep white matter consistent with small vessel ischemia. Mild ventricular dilatation consistent with some central atrophy. No mass effect or midline shift. No abnormal extra-axial fluid collections. Gray-white matter junctions are distinct. Basal cisterns are not effaced. No evidence of acute intracranial hemorrhage. No depressed skull fractures. Opacification of the left maxillary antrum. Mucous or retention cysts in the sphenoid sinus and left maxillary antrum. Right nasal catheter and endotracheal tubes are demonstrated. Mastoid air cells are not opacified.  IMPRESSION: No acute intracranial abnormalities. Chronic atrophy and small vessel ischemic changes. Inflammatory changes suggested in the paranasal sinuses.   Electronically Signed   By:  Burman NievesWilliam  Stevens M.D.   On: 06/16/2014 01:39   Dg Chest Port 1 View  06/16/2014   CLINICAL DATA:  Respiratory failure.  EXAM: PORTABLE CHEST - 1 VIEW  COMPARISON:  06/15/2014 .  FINDINGS: Endotracheal tube, NG tube, right IJ line in stable position. The stomach hilar structures normal. Lungs are clear. No pleural effusion or pneumothorax. Right anterior second rib fracture.  IMPRESSION: 1. Lines and tubes  in stable position. 2. No acute cardiopulmonary disease. 3. Right anterior second rib fracture.  No pneumothorax.   Electronically Signed   By: Maisie Fushomas  Register   On: 06/16/2014 08:12   Dg Chest Port 1 View  06/15/2014   CLINICAL DATA:  Code blue. Endotracheal tube placement. Unresponsive.  EXAM: PORTABLE CHEST - 1 VIEW  COMPARISON:  06/14/2014  FINDINGS: Endotracheal tube tip measures 6.6 cm above the carinal. Enteric tube tip is off the field of view but below the left hemidiaphragm right central venous catheter tip over the lower SVC region. No pneumothorax. Shallow inspiration. Heart size and pulmonary vascularity are normal. No focal airspace disease or consolidation in the lungs. No pneumothorax. Probable fracture of the anterior right second rib.  IMPRESSION: Appliances appear in satisfactory position. Lungs are clear. Probable right second anterior rib fracture.   Electronically Signed   By: Burman NievesWilliam  Stevens M.D.   On: 06/15/2014 06:56   Dg Chest Port 1 View  06/14/2014   CLINICAL DATA:  History pneumonia  EXAM: PORTABLE CHEST - 1 VIEW  COMPARISON:  Chest x-ray of 06/12/2014  FINDINGS: There is mild atelectasis in the lung bases but no definite infiltrate or pleural effusion is seen. Linear atelectasis is noted more prominently at the right lung base. A right central venous line tip overlies the expected SVC -RA junction. An NG tube is present extending into the stomach. The heart size is within normal limits.  IMPRESSION: Bibasilar atelectasis.  No definite pneumonia or effusion.   Electronically Signed   By: Dwyane DeePaul  Barry M.D.   On: 06/14/2014 14:34    ASSESSMENT / PLAN:   Respiratory Arrest - bradycardia then arrest on 12/10, required intubation  R 2nd Rib Fracture - post CPR  Polysubstance Abuse  Encephalopathy C-Diff Colitis  Cirrhosis with Portal Hypertension & Ascites   Plan: Duoneb Q6 + Q3 PRN albuterol  IMV support, adjust rate,   Sputum culture  ABX per primary  CT head to  evaluate for acute process negative 12/10 Follows commands but with copious secretions. No extubation today, consider trach since he has had multiple intubations.  Brett CanalesSteve Minor ACNP [facility] Pager [phone] till 3 pm If no answer page [phone] 06/16/2014, 11:24 AM  Also recommend aggressive diureses inorder to address copious secretion.  If family and patient continue to wish for full support then would recommend trach given multiple intubations in a short period of time.  In the meantime, continue aggressive weaning.  The hope is once trached will liberate from the ventilator rather quickly.  Patient seen and examined, agree with above note.  I dictated the care and orders written for this patient under my direction.  Alyson ReedyWesam G Nyal Schachter, MD [phone]

## 2014-06-17 LAB — CBC
HCT: 30.3 % — ABNORMAL LOW (ref 39.0–52.0)
Hemoglobin: 9.5 g/dL — ABNORMAL LOW (ref 13.0–17.0)
MCH: 30.5 pg (ref 26.0–34.0)
MCHC: 31.4 g/dL (ref 30.0–36.0)
MCV: 97.4 fL (ref 78.0–100.0)
Platelets: 162 10*3/uL (ref 150–400)
RBC: 3.11 MIL/uL — AB (ref 4.22–5.81)
RDW: 13.8 % (ref 11.5–15.5)
WBC: 6.1 10*3/uL (ref 4.0–10.5)

## 2014-06-17 LAB — BASIC METABOLIC PANEL
ANION GAP: 14 (ref 5–15)
BUN: 20 mg/dL (ref 6–23)
CO2: 23 mEq/L (ref 19–32)
Calcium: 8.8 mg/dL (ref 8.4–10.5)
Chloride: 104 mEq/L (ref 96–112)
Creatinine, Ser: 0.69 mg/dL (ref 0.50–1.35)
GFR calc Af Amer: >90 mL/min (ref >90)
Glucose, Bld: 104 mg/dL — ABNORMAL HIGH (ref 70–99)
POTASSIUM: 4.4 meq/L (ref 3.7–5.3)
SODIUM: 141 meq/L (ref 137–147)

## 2014-06-18 LAB — BASIC METABOLIC PANEL
Anion gap: 14 (ref 5–15)
BUN: 14 mg/dL (ref 6–23)
CHLORIDE: 102 meq/L (ref 96–112)
CO2: 23 meq/L (ref 19–32)
Calcium: 8.8 mg/dL (ref 8.4–10.5)
Creatinine, Ser: 0.54 mg/dL (ref 0.50–1.35)
GFR calc non Af Amer: >90 mL/min (ref >90)
Glucose, Bld: 130 mg/dL — ABNORMAL HIGH (ref 70–99)
POTASSIUM: 3.8 meq/L (ref 3.7–5.3)
Sodium: 139 mEq/L (ref 137–147)

## 2014-06-18 LAB — CBC
HCT: 31.9 % — ABNORMAL LOW (ref 39.0–52.0)
Hemoglobin: 9.8 g/dL — ABNORMAL LOW (ref 13.0–17.0)
MCH: 29.7 pg (ref 26.0–34.0)
MCHC: 30.7 g/dL (ref 30.0–36.0)
MCV: 96.7 fL (ref 78.0–100.0)
PLATELETS: 157 10*3/uL (ref 150–400)
RBC: 3.3 MIL/uL — ABNORMAL LOW (ref 4.22–5.81)
RDW: 13.3 % (ref 11.5–15.5)
WBC: 5.7 10*3/uL (ref 4.0–10.5)

## 2014-06-19 ENCOUNTER — Encounter: Payer: Self-pay | Admitting: Certified Registered"

## 2014-06-19 ENCOUNTER — Other Ambulatory Visit (HOSPITAL_COMMUNITY): Payer: Self-pay

## 2014-06-19 DIAGNOSIS — J9601 Acute respiratory failure with hypoxia: Secondary | ICD-10-CM | POA: Diagnosis not present

## 2014-06-19 DIAGNOSIS — K7031 Alcoholic cirrhosis of liver with ascites: Secondary | ICD-10-CM | POA: Diagnosis not present

## 2014-06-19 DIAGNOSIS — J9600 Acute respiratory failure, unspecified whether with hypoxia or hypercapnia: Secondary | ICD-10-CM | POA: Diagnosis not present

## 2014-06-19 DIAGNOSIS — K7460 Unspecified cirrhosis of liver: Secondary | ICD-10-CM | POA: Insufficient documentation

## 2014-06-19 LAB — BLOOD GAS, ARTERIAL
ACID-BASE EXCESS: 4.2 mmol/L — AB (ref 0.0–2.0)
ACID-BASE EXCESS: 3.1 mmol/L — AB (ref 0.0–2.0)
BICARBONATE: 27.3 meq/L — AB (ref 20.0–24.0)
Bicarbonate: 26.3 mEq/L — ABNORMAL HIGH (ref 20.0–24.0)
FIO2: 0.35 %
FIO2: 0.6 %
LHR: 14 {breaths}/min
MECHVT: 500 mL
O2 SAT: 99.2 %
O2 SAT: 99.2 %
PATIENT TEMPERATURE: 98.1
PEEP/CPAP: 5 cmH2O
PEEP/CPAP: 5 cmH2O
Patient temperature: 98.6
Pressure support: 5 cmH2O
TCO2: 27.4 mmol/L (ref 0–100)
TCO2: 28.3 mmol/L (ref 0–100)
pCO2 arterial: 33.7 mmHg — ABNORMAL LOW (ref 35.0–45.0)
pCO2 arterial: 35 mmHg (ref 35.0–45.0)
pH, Arterial: 7.489 — ABNORMAL HIGH (ref 7.350–7.450)
pH, Arterial: 7.517 — ABNORMAL HIGH (ref 7.350–7.450)
pO2, Arterial: 139 mmHg — ABNORMAL HIGH (ref 80.0–100.0)
pO2, Arterial: 186 mmHg — ABNORMAL HIGH (ref 80.0–100.0)

## 2014-06-19 MED ORDER — SUCCINYLCHOLINE CHLORIDE 20 MG/ML IJ SOLN
INTRAMUSCULAR | Status: DC | PRN
  Administered 2014-06-19: 100 mg via INTRAVENOUS

## 2014-06-19 MED ORDER — PROPOFOL 10 MG/ML IV BOLUS
INTRAVENOUS | Status: DC | PRN
  Administered 2014-06-19: 140 mg via INTRAVENOUS

## 2014-06-19 NOTE — Progress Notes (Signed)
[facility] PCCM Seen and examined, detailed hand written [facility] CC time 30 minutes Heber CarolinaBrent McQuaid, MD [place] [facility]M Pager: [phone] Cell: [phone] If no response, call [phone]

## 2014-06-19 NOTE — Progress Notes (Signed)
Name: Paul Scott MRN: [id number] DOB: 01/04/1966    ADMISSION DATE:  06/07/2014 CONSULTATION DATE:  06/15/14  REFERRING MD :  Dr. Sharyon MedicusHijazi   CHIEF COMPLAINT:  Cardiopulmonary arrest  BRIEF PATIENT DESCRIPTION: 48 y/o M with PMH of polysubstance abuse   SIGNIFICANT EVENTS  11/23  [facility], intubated 12/02  [facility] on phenobarbitol  12/10  Bradycardic arrest, intubated  12/14 planned extubated @ 12 n.    STUDIES:  12/10  CT Head >> neg    HISTORY OF PRESENT ILLNESS: 48 y/o M with PMH of polysubstance abuse [facility] after being found unresponsive in his yard.  He suffered a PEA arrest in the ER and was admitted to the ICU.  Arrest was thought to be related to substance abuse.  Hospital work up at that time was positive for cirrhosis with portal hypertension and ascites.  He underwent paracentesis with 4L of fluid removed.  The patient was extubated and moved to SDU where he developed DT's and severe encephalopathy.  He was treated with IV benzo's which did not improve patient symptoms.  He was evaluated by psychiatry and placed on phenobarbitol.  Hospital course complicated by tachycardia, hypertension, and c-diff colitis (dx on 06/01/14.  He was treated with Flagyl (planned stop date 06/15/14).  Per notes, the patient was awake, alert and able to take pills.  [facility] [place] [facility].  On 12/10, the patient suffered an bradycardic arrest and was intubated.  PCCM consulted for evaluation.  [facility], the patient has not been awake / oriented since admission.        SUBJECTIVE:   VITAL SIGNS:  Vital signs reviewed. Abnormal values will appear under impression plan section.     PHYSICAL EXAMINATION: General:  Thin adult male in NAD on vent Neuro: follows commands on vent but sleepy HEENT:  OETT, thick oral secretions  Cardiovascular:  s1s2 rrr, no m/r/g  Lungs:  resp's  even/non-labored, lungs bilaterally diminished  Abdomen:  Round/soft, bsx4 active  Musculoskeletal:  No acute deformities  Skin:  Warm/dry, multiple tattoos   Recent Labs Lab 06/16/14 0500 06/17/14 0502 06/18/14 0703  NA 141 141 139  K 4.1 4.4 3.8  CL 104 104 102  CO2 25 23 23   BUN 24* 20 14  CREATININE 0.76 0.69 0.54  GLUCOSE 114* 104* 130*    Recent Labs Lab 06/16/14 0500 06/17/14 0502 06/18/14 0703  HGB 10.4* 9.5* 9.8*  HCT 32.6* 30.3* 31.9*  WBC 8.9 6.1 5.7  PLT 181 162 157   No results found.  ASSESSMENT / PLAN:   Respiratory Arrest - bradycardia then arrest on 12/10, required intubation  R 2nd Rib Fracture - post CPR  Polysubstance Abuse  Encephalopathy on phenobarb Szs post code with negative head CT C-Diff Colitis  Cirrhosis with Portal Hypertension & Ascites   Plan: Duoneb Q6 + Q3 PRN albuterol   Sputum culture  ABX per primary  .12/14 in PS 5/5 cpap rr 18 Vt 430 .4/ sats 97% Plan to hold all sedation and attempt extubation at 12 n 12/14. If re intubated then proceed with early trach.  Brett CanalesSteve Minor ACNP [facility] Pager [phone] till 3 pm If no answer page [phone] 06/19/2014, 10:14 AM  PCCM ATTENDING: Pt seen on work rounds with care provider noted above. We reviewed pt's initial presentation, consultants notes and hospital database in detail.  The  above assessment and plan was formulated under my direction.  Pt extubated earlier today. He is somnolent with poor cough reflex and upper airway secretions. Although he does not require re-intubation presently, he is at high risk for such. Rec:  1) hold TFs in anticipation of possible re-intubation 2) DC all sedating medications 3) NTS PRN 4) follow up CXR for 12/15 ordered 5) please call if he deteriorates  Billy Fischeravid Simonds, MD;  PCCM service; Mobile [phone]

## 2014-06-19 NOTE — Transfer of Care (Signed)
OOD intubation 

## 2014-06-20 ENCOUNTER — Other Ambulatory Visit (HOSPITAL_COMMUNITY): Payer: Self-pay

## 2014-06-20 LAB — CULTURE, BLOOD (ROUTINE X 2)
Culture: NO GROWTH
Culture: NO GROWTH

## 2014-06-20 LAB — CBC WITH DIFFERENTIAL/PLATELET
Basophils Absolute: 0 10*3/uL (ref 0.0–0.1)
Basophils Relative: 1 % (ref 0–1)
EOS PCT: 2 % (ref 0–5)
Eosinophils Absolute: 0.1 10*3/uL (ref 0.0–0.7)
HCT: 29.6 % — ABNORMAL LOW (ref 39.0–52.0)
Hemoglobin: 9.5 g/dL — ABNORMAL LOW (ref 13.0–17.0)
LYMPHS ABS: 1 10*3/uL (ref 0.7–4.0)
Lymphocytes Relative: 12 % (ref 12–46)
MCH: 30.6 pg (ref 26.0–34.0)
MCHC: 32.1 g/dL (ref 30.0–36.0)
MCV: 95.5 fL (ref 78.0–100.0)
MONOS PCT: 8 % (ref 3–12)
Monocytes Absolute: 0.6 10*3/uL (ref 0.1–1.0)
Neutro Abs: 6.4 10*3/uL (ref 1.7–7.7)
Neutrophils Relative %: 77 % (ref 43–77)
Platelets: 181 10*3/uL (ref 150–400)
RBC: 3.1 MIL/uL — ABNORMAL LOW (ref 4.22–5.81)
RDW: 13.5 % (ref 11.5–15.5)
WBC: 8.2 10*3/uL (ref 4.0–10.5)

## 2014-06-20 LAB — BLOOD GAS, ARTERIAL
ACID-BASE EXCESS: 4 mmol/L — AB (ref 0.0–2.0)
BICARBONATE: 27.2 meq/L — AB (ref 20.0–24.0)
FIO2: 0.4 %
MECHVT: 500 mL
O2 Saturation: 99.2 %
PCO2 ART: 36.3 mmHg (ref 35.0–45.0)
PEEP: 5 cmH2O
PO2 ART: 150 mmHg — AB (ref 80.0–100.0)
Patient temperature: 100
RATE: 14 resp/min
TCO2: 28.2 mmol/L (ref 0–100)
pH, Arterial: 7.49 — ABNORMAL HIGH (ref 7.350–7.450)

## 2014-06-20 LAB — COMPREHENSIVE METABOLIC PANEL
ALT: 27 U/L (ref 0–53)
AST: 34 U/L (ref 0–37)
Albumin: 2.4 g/dL — ABNORMAL LOW (ref 3.5–5.2)
Alkaline Phosphatase: 218 U/L — ABNORMAL HIGH (ref 39–117)
Anion gap: 20 — ABNORMAL HIGH (ref 5–15)
BILIRUBIN TOTAL: 0.5 mg/dL (ref 0.3–1.2)
BUN: 15 mg/dL (ref 6–23)
CALCIUM: 7.8 mg/dL — AB (ref 8.4–10.5)
CO2: 24 meq/L (ref 19–32)
CREATININE: 0.57 mg/dL (ref 0.50–1.35)
Chloride: 104 mEq/L (ref 96–112)
GFR calc Af Amer: >90 mL/min (ref >90)
GLUCOSE: 84 mg/dL (ref 70–99)
Potassium: 3.2 mEq/L — ABNORMAL LOW (ref 3.7–5.3)
Sodium: 148 mEq/L — ABNORMAL HIGH (ref 137–147)
Total Protein: 6.5 g/dL (ref 6.0–8.3)

## 2014-06-20 LAB — PROTIME-INR
INR: 1.36 (ref 0.00–1.49)
Prothrombin Time: 17 seconds — ABNORMAL HIGH (ref 11.6–15.2)

## 2014-06-21 ENCOUNTER — Other Ambulatory Visit (HOSPITAL_COMMUNITY): Payer: Self-pay

## 2014-06-21 ENCOUNTER — Encounter (HOSPITAL_COMMUNITY): Payer: Self-pay

## 2014-06-21 DIAGNOSIS — I639 Cerebral infarction, unspecified: Secondary | ICD-10-CM | POA: Insufficient documentation

## 2014-06-21 DIAGNOSIS — I6300 Cerebral infarction due to thrombosis of unspecified precerebral artery: Secondary | ICD-10-CM | POA: Diagnosis not present

## 2014-06-21 DIAGNOSIS — J9601 Acute respiratory failure with hypoxia: Secondary | ICD-10-CM | POA: Diagnosis not present

## 2014-06-21 DIAGNOSIS — K7030 Alcoholic cirrhosis of liver without ascites: Secondary | ICD-10-CM

## 2014-06-21 LAB — MAGNESIUM: Magnesium: 2.1 mg/dL (ref 1.5–2.5)

## 2014-06-21 LAB — PROTIME-INR
INR: 1.18 (ref 0.00–1.49)
Prothrombin Time: 15.2 seconds (ref 11.6–15.2)

## 2014-06-21 LAB — LACTIC ACID, PLASMA: LACTIC ACID, VENOUS: 1.1 mmol/L (ref 0.5–2.2)

## 2014-06-21 LAB — BASIC METABOLIC PANEL
ANION GAP: 11 (ref 5–15)
BUN: 22 mg/dL (ref 6–23)
CHLORIDE: 100 meq/L (ref 96–112)
CO2: 28 meq/L (ref 19–32)
Calcium: 9.2 mg/dL (ref 8.4–10.5)
Creatinine, Ser: 0.62 mg/dL (ref 0.50–1.35)
GFR calc Af Amer: >90 mL/min (ref >90)
GFR calc non Af Amer: >90 mL/min (ref >90)
Glucose, Bld: 98 mg/dL (ref 70–99)
POTASSIUM: 3.7 meq/L (ref 3.7–5.3)
SODIUM: 139 meq/L (ref 137–147)

## 2014-06-21 LAB — CBC
HCT: 32.5 % — ABNORMAL LOW (ref 39.0–52.0)
Hemoglobin: 10.4 g/dL — ABNORMAL LOW (ref 13.0–17.0)
MCH: 30.4 pg (ref 26.0–34.0)
MCHC: 32 g/dL (ref 30.0–36.0)
MCV: 95 fL (ref 78.0–100.0)
Platelets: 170 10*3/uL (ref 150–400)
RBC: 3.42 MIL/uL — AB (ref 4.22–5.81)
RDW: 13.1 % (ref 11.5–15.5)
WBC: 6.7 10*3/uL (ref 4.0–10.5)

## 2014-06-21 NOTE — Anesthesia Preprocedure Evaluation (Signed)
Anesthesia Evaluation  Patient identified by MRN, date of birth, ID band Patient awake  General Assessment Comment:Pt in acute respiratory distressPreop documentation limited or incomplete due to emergent nature of procedure.  Airway        Dental   Pulmonary          Cardiovascular     Neuro/Psych    GI/Hepatic   Endo/Other    Renal/GU      Musculoskeletal   Abdominal   Peds  Hematology   Anesthesia Other Findings   Reproductive/Obstetrics                             Anesthesia Physical Anesthesia Plan  ASA: III and emergent  Anesthesia Plan:    Post-op Pain Management:    Induction: Intravenous, Rapid sequence and Cricoid pressure planned  Airway Management Planned: Oral ETT  Additional Equipment:   Intra-op Plan:   Post-operative Plan:   Informed Consent:   Plan Discussed with:   Anesthesia Plan Comments:         Anesthesia Quick Evaluation

## 2014-06-21 NOTE — Procedures (Signed)
Bedside Tracheostomy Insertion Procedure Note   Patient Details:   Name: Paul Scott DOB: 02/01/1966 MRN: [id number]  Procedure: Tracheostomy  Pre Procedure Assessment: ET Tube Size:7.5 ET Tube secured at lip (cm):23 Bite block in place: No Breath Sounds: Diminished  Post Procedure Assessment: There were no vitals taken for this visit. O2 sats: stable throughout Complications: No apparent complications Patient did tolerate procedure well Tracheostomy Brand:Shiley Tracheostomy Style:Cuffed Tracheostomy Size: 6.0 Tracheostomy Secured WUJ:[id number]:Sutures, velcro Tracheostomy Placement Confirmation:Trach cuff visualized and in place, cxr taken for placement    Readling, Tammie Ann 06/21/2014, 11:38 AM       Billy Fischeravid Margaretta Chittum, MD ; [facility] service Mobile [phone].  After 5:30 PM or weekends, call [phone]

## 2014-06-21 NOTE — Procedures (Signed)
Name:  Paul Scott MRN:  [id number] DOB:  01/31/1966  OPERATIVE NOTE  Procedure:  Percutaneous tracheostomy.  Indications:  Ventilator-dependent respiratory failure.  Consent:  Procedure, alternatives, risks and benefits discussed with medical POA.  Questions answered.  Consent obtained.  Anesthesia:  Versed, fent, etomidate, prop, vec  Procedure summary:  Appropriate equipment was assembled.  The patient was identified as Paul Scott and safety timeout was performed. The patient was placed in supine position with a towel roll behind shoulder blades and neck extended.  Sterile technique was used. The patient's neck and upper chest were prepped using chlorhexidine / alcohol scrub and the field was draped in usual sterile fashion with full body drape. After the adequate sedation / anesthesia was achieved, attention was directed at the midline trachea, where the cricothyroid membrane was palpated. Approximately two fingerbreadths above the sternal notch, a verticle incision was created with a scalpel after local infiltration with 0.2% Lidocaine. Then, using Seldinger technique and a percutaneous tracheostomy set, the trachea was entered with a 14 gauge needle with an overlying sheath. This was all confirmed under direct visualization of a fiberoptic flexible bronchoscope. Entrance into the trachea was identified through the third tracheal ring interspace. Following this, a guidewire was inserted. The needle was removed, leaving the sheath and the guidewire intact. Next, the sheath was removed and a small dilator was inserted. The tracheal rings were then dilated. A #6 Shiley was then opened. The balloon was checked. It was placed over a tracheal dilator, which was then advanced over the guidewire and through the previously dilated tract. The Shiley tracheostomy tube was noted to pass in the trachea with little resistance. The guidewire and dilator tubes were removed from the trachea. An inner cannula was  placed through the tracheostomy tube. The tracheostomy was then secured at the anterior neck with 4 monofilament sutures. The oral endotracheal tube was removed and the ventilator was attached to the newly placed tracheostomy tube. Adequate tidal volumes were noted. The cuff was inflated and no evidence of air leak was noted. No evidence of bleeding was noted. At this point, the procedure was concluded. Post-procedure chest x-ray was ordered.  Complications:  No immediate complications were noted.  Hemodynamic parameters and oxygenation remained stable throughout the procedure.  Estimated blood loss:  Less then 1 mL.  Nelda BucksFEINSTEIN,Jeff Mccallum J., MD Pulmonary and Critical Care Medicine [facility] Pager: [phone]  06/21/2014, 4:13 PM

## 2014-06-21 NOTE — Procedures (Signed)
Bronchoscopy  for Percutaneous  Tracheostomy  Name: Paul GellJohn Scott MRN: [id number] DOB: 07/18/1965 Procedure: Bronchoscopy for Percutaneous Tracheostomy Indications: Diagnostic evaluation of the airways In conjunction with: Dr. Tyson AliasFeinstein   Procedure Details Consent: Risks of procedure as well as the alternatives and risks of each were explained to the (patient/caregiver).  Consent for procedure obtained. Time Out: Verified patient identification, verified procedure, site/side was marked, verified correct patient position, special equipment/implants available, medications/allergies/relevent history reviewed, required imaging and test results available.  Performed  In preparation for procedure, patient was given 100% FiO2 and bronchoscope lubricated. Sedation: Benzodiazepines, Muscle relaxants and Etomidate  Airway entered and the following bronchi were examined: RML.   Procedures performed: Endotracheal Tube retracted in 2 cm increments. Cannulation of airway observed. Dilation observed. Placement of trachel tube  observed . No overt complications. Bronchoscope removed.    Evaluation Hemodynamic Status: BP stable throughout; O2 sats: stable throughout Patient's Current Condition: stable Specimens:  None Complications: No apparent complications Patient did tolerate procedure well.   Brett CanalesSteve Minor ACNP [facility] Pager [phone] till 3 pm If no answer page [phone] 06/21/2014, 11:09 AM   Billy Fischeravid Taisia Fantini, MD ; [facility] service Mobile [phone].  After 5:30 PM or weekends, call [phone]

## 2014-06-22 ENCOUNTER — Other Ambulatory Visit (HOSPITAL_COMMUNITY): Payer: Self-pay

## 2014-06-22 DIAGNOSIS — J9601 Acute respiratory failure with hypoxia: Secondary | ICD-10-CM | POA: Diagnosis not present

## 2014-06-22 LAB — BASIC METABOLIC PANEL
Anion gap: 13 (ref 5–15)
BUN: 12 mg/dL (ref 6–23)
CO2: 22 mEq/L (ref 19–32)
Calcium: 8.6 mg/dL (ref 8.4–10.5)
Chloride: 100 mEq/L (ref 96–112)
Creatinine, Ser: 0.49 mg/dL — ABNORMAL LOW (ref 0.50–1.35)
GFR calc Af Amer: >90 mL/min (ref >90)
GLUCOSE: 119 mg/dL — AB (ref 70–99)
POTASSIUM: 3.9 meq/L (ref 3.7–5.3)
Sodium: 135 mEq/L — ABNORMAL LOW (ref 137–147)

## 2014-06-22 LAB — CBC
HEMATOCRIT: 28.8 % — AB (ref 39.0–52.0)
Hemoglobin: 9 g/dL — ABNORMAL LOW (ref 13.0–17.0)
MCH: 30 pg (ref 26.0–34.0)
MCHC: 31.3 g/dL (ref 30.0–36.0)
MCV: 96 fL (ref 78.0–100.0)
Platelets: 168 10*3/uL (ref 150–400)
RBC: 3 MIL/uL — AB (ref 4.22–5.81)
RDW: 13.1 % (ref 11.5–15.5)
WBC: 7.9 10*3/uL (ref 4.0–10.5)

## 2014-06-22 NOTE — Progress Notes (Addendum)
Name: Paul Scott MRN: [id number] DOB: 02/12/1966    ADMISSION DATE:  06/07/2014 CONSULTATION DATE:  06/15/14  REFERRING MD :  Dr. Sharyon MedicusHijazi   CHIEF COMPLAINT:  Cardiopulmonary arrest  BRIEF PATIENT DESCRIPTION: 48 y/o M with PMH of polysubstance abuse   SIGNIFICANT EVENTS  11/23  [facility], intubated 12/02  [name], on phenobarbitol  12/10  Bradycardic arrest, intubated  12/14 planned extubated @ 12 n. Failed with reintubation 12/15 12/16 trached DF>>   STUDIES:  12/10  CT Head >> neg    HISTORY OF PRESENT ILLNESS: 48 y/o M with PMH of polysubstance abuse previously [facility]/14 after being found unresponsive in his yard.  He suffered a PEA arrest in the ER and was admitted to the ICU.  Arrest was thought to be related to substance abuse.  Hospital work up at that time was positive for cirrhosis with portal hypertension and ascites.  He underwent paracentesis with 4L of fluid removed.  The patient was extubated and moved to SDU where he developed DT's and severe encephalopathy.  He was treated with IV benzo's which did not improve patient symptoms.  He was evaluated by psychiatry and placed on phenobarbitol.  Hospital course complicated by tachycardia, hypertension, and c-diff colitis (dx on 06/01/14.  He was treated with Flagyl (planned stop date 06/15/14).  Per notes, the patient was awake, alert and able to take pills.  [facility] [place] [facility].  On 12/10, the patient suffered an bradycardic arrest and was intubated.  PCCM consulted for evaluation.  [facility], the patient has not been awake / oriented since admission.        SUBJECTIVE:   VITAL SIGNS:  Vital signs reviewed. Abnormal values will appear under impression plan section.     PHYSICAL EXAMINATION: General:  Thin adult male in NAD on vent. Heavily sedated Neuro: heavily sedated, no follows commands HEENT:  Trach CDI    Cardiovascular:  s1s2 rrr, no m/r/g  Lungs:  resp's even/non-labored, lungs bilaterally diminished  Abdomen:  Round/soft, bsx4 active  Musculoskeletal:  No acute deformities  Skin:  Warm/dry, multiple tattoos   Recent Labs Lab 06/20/14 0500 06/21/14 0147 06/22/14 0750  NA 148* 139 135*  K 3.2* 3.7 3.9  CL 104 100 100  CO2 24 28 22   BUN 15 22 12   CREATININE 0.57 0.62 0.49*  GLUCOSE 84 98 119*    Recent Labs Lab 06/20/14 0500 06/21/14 0147 06/22/14 0750  HGB 9.5* 10.4* 9.0*  HCT 29.6* 32.5* 28.8*  WBC 8.2 6.7 7.9  PLT 181 170 168   Dg Chest Port 1 View  06/22/2014   CLINICAL DATA:  Respiratory failure  EXAM: PORTABLE CHEST - 1 VIEW  COMPARISON:  June 21, 2014  FINDINGS: Tracheostomy catheter tip is 4.4 cm above the carina. Nasogastric tube tip and side port are below the diaphragm. Central catheter tip is in the right atrium near the cavoatrial junction. No pneumothorax. No edema or consolidation. Heart size and pulmonary vascularity are normal. No adenopathy.  IMPRESSION: Tube and catheter positions as described without pneumothorax. No edema or consolidation.   Electronically Signed   By: Bretta BangWilliam  Woodruff M.D.   On: 06/22/2014 07:47   Dg Chest Port 1 View  06/21/2014   CLINICAL DATA:  48 year old male presenting with shortness of breath and respiratory failure.  EXAM: PORTABLE CHEST - 1 VIEW  COMPARISON:  Chest x-ray  06/20/2014.  FINDINGS: Previously noted endotracheal tube has been removed, and there is a new tracheostomy tube in position which appears properly located, with the tip approximately 3.7 cm above the carina. A nasogastric tube is seen extending into the stomach, however, the tip of the nasogastric tube extends below the lower margin of the image. Right upper extremity PICC with tip terminating at the superior cavoatrial junction is unchanged. Lung volumes are low. No consolidative airspace disease. No pleural effusion. No pneumothorax. No evidence of  pulmonary edema. Heart size and mediastinal contours are within normal limits.  IMPRESSION: 1. Support apparatus, as above. 2. Low lung volumes without radiographic evidence of acute cardiopulmonary disease.   Electronically Signed   By: Trudie Reedaniel  Entrikin M.D.   On: 06/21/2014 12:14    ASSESSMENT / PLAN:   Respiratory Arrest - bradycardia then arrest on 12/10, required intubation. Failed extubation attempt 12/14 reintubated and trached 12/16 R 2nd Rib Fracture - post CPR  Polysubstance Abuse  Encephalopathy on phenobarb Szs post code with negative head CT C-Diff Colitis  Cirrhosis with Portal Hypertension & Ascites   Plan: Duoneb Q6 + Q3 PRN albuterol   Sputum culture  ABX per primary  Trached 12/16  Brett CanalesSteve Minor ACNP [facility] Pager [phone] till 3 pm If no answer page [phone] 06/22/2014, 9:07 AM  PCCM ATTENDING: Pt seen on work rounds with care provider noted above. We reviewed pt's initial presentation, consultants notes and hospital database in detail.  The above assessment and plan was formulated under my direction.  He should progress very rapidly to ATC as he needed airway more than he needed a vent I have ordered to DC propofol and discussed with the RT and RN   Billy Fischeravid Simonds, MD;  PCCM service; Mobile [phone]

## 2014-06-23 LAB — VANCOMYCIN, TROUGH: Vancomycin Tr: <5 ug/mL — ABNORMAL LOW (ref 10.0–20.0)

## 2014-06-26 DIAGNOSIS — J9601 Acute respiratory failure with hypoxia: Secondary | ICD-10-CM | POA: Diagnosis not present

## 2014-06-26 DIAGNOSIS — Z930 Tracheostomy status: Secondary | ICD-10-CM

## 2014-06-26 LAB — COMPREHENSIVE METABOLIC PANEL
ALK PHOS: 368 U/L — AB (ref 39–117)
ALT: 41 U/L (ref 0–53)
ANION GAP: 13 (ref 5–15)
AST: 44 U/L — ABNORMAL HIGH (ref 0–37)
Albumin: 2.8 g/dL — ABNORMAL LOW (ref 3.5–5.2)
BILIRUBIN TOTAL: 0.5 mg/dL (ref 0.3–1.2)
BUN: 12 mg/dL (ref 6–23)
CHLORIDE: 99 meq/L (ref 96–112)
CO2: 26 meq/L (ref 19–32)
CREATININE: 0.43 mg/dL — AB (ref 0.50–1.35)
Calcium: 9.2 mg/dL (ref 8.4–10.5)
GLUCOSE: 116 mg/dL — AB (ref 70–99)
POTASSIUM: 4 meq/L (ref 3.7–5.3)
Sodium: 138 mEq/L (ref 137–147)
Total Protein: 6.9 g/dL (ref 6.0–8.3)

## 2014-06-26 LAB — CBC
HCT: 31.3 % — ABNORMAL LOW (ref 39.0–52.0)
Hemoglobin: 9.9 g/dL — ABNORMAL LOW (ref 13.0–17.0)
MCH: 30 pg (ref 26.0–34.0)
MCHC: 31.6 g/dL (ref 30.0–36.0)
MCV: 94.8 fL (ref 78.0–100.0)
PLATELETS: 173 10*3/uL (ref 150–400)
RBC: 3.3 MIL/uL — ABNORMAL LOW (ref 4.22–5.81)
RDW: 13.1 % (ref 11.5–15.5)
WBC: 7 10*3/uL (ref 4.0–10.5)

## 2014-06-26 NOTE — Progress Notes (Signed)
Name: Paul Scott MRN: [id number] DOB: 02/04/1966    ADMISSION DATE:  06/07/2014 CONSULTATION DATE:  06/15/14  REFERRING MD :  Dr. Sharyon MedicusHijazi   CHIEF COMPLAINT:  Cardiopulmonary arrest  BRIEF PATIENT DESCRIPTION: 48 y/o M with PMH of polysubstance abuse   SIGNIFICANT EVENTS  11/23  [facility], intubated 12/02  [facility], on phenobarbitol  12/10  Bradycardic arrest, intubated  12/14 planned extubated @ 12 n. Failed with reintubation 12/15 12/16 trached DF>> 12/17 liberated from vent   STUDIES:  12/10  CT Head >> neg    HISTORY OF PRESENT ILLNESS: 48 y/o M with PMH of polysubstance abuse [facility] after being found unresponsive in his yard.  He suffered a PEA arrest in the ER and was admitted to the ICU.  Arrest was thought to be related to substance abuse.  Hospital work up at that time was positive for cirrhosis with portal hypertension and ascites.  He underwent paracentesis with 4L of fluid removed.  The patient was extubated and moved to SDU where he developed DT's and severe encephalopathy.  He was treated with IV benzo's which did not improve patient symptoms.  He was evaluated by psychiatry and placed on phenobarbitol.  Hospital course complicated by tachycardia, hypertension, and c-diff colitis (dx on 06/01/14.  He was treated with Flagyl (planned stop date 06/15/14).  Per notes, the patient was awake, alert and able to take pills.  [facility] [place] [facility].  On 12/10, the patient suffered an bradycardic arrest and was intubated.  PCCM consulted for evaluation.  [facility], the patient has not been awake / oriented since admission.        SUBJECTIVE:   VITAL SIGNS:  Vital signs reviewed. Abnormal values will appear under impression plan section.     PHYSICAL EXAMINATION: General:  Thin adult male in NAD on t collar Neuro: Follows commands, laughs, MAEx 4 HEENT:  Trach CDI    Cardiovascular:  s1s2 rrr, no m/r/g  Lungs:  resp's even/non-labored, lungs bilaterally diminished , copious secretions   Abdomen:  Round/soft, bsx4 active ,FT rt nare Musculoskeletal:  No acute deformities  Skin:  Warm/dry, multiple tattoos   Recent Labs Lab 06/21/14 0147 06/22/14 0750 06/26/14 0500  NA 139 135* 138  K 3.7 3.9 4.0  CL 100 100 99  CO2 28 22 26   BUN 22 12 12   CREATININE 0.62 0.49* 0.43*  GLUCOSE 98 119* 116*    Recent Labs Lab 06/21/14 0147 06/22/14 0750 06/26/14 0500  HGB 10.4* 9.0* 9.9*  HCT 32.5* 28.8* 31.3*  WBC 6.7 7.9 7.0  PLT 170 168 173   No results found.  ASSESSMENT / PLAN:   Respiratory Arrest - bradycardia then arrest on 12/10, required intubation. Failed extubation attempt 12/14 reintubated and trached 12/16 and  R 2nd Rib Fracture - post CPR  Polysubstance Abuse  Encephalopathy on phenobarb Szs post code with negative head CT C-Diff Colitis  Cirrhosis with Portal Hypertension & Ascites   Plan: BD ABX per primary  Trached 12/16 and liberated from vent 12/17 Leave trach in until mental status has improved. He has 2 Resp/cardiac arrests. PCCM will see weekly while trached.  Brett CanalesSteve Minor ACNP [facility] Pager [phone] till 3 pm If no answer page [phone] 06/26/2014, 9:15 AM  PCCM ATTENDING: He is tolerating trach collar trials and is able to manage his secretions. Would need significant  improvement in his mental status and conditioning for us to consider decannulation eventually.  Oretha MilchALVA,Shonnie Poudrier V. MD

## 2014-06-29 ENCOUNTER — Other Ambulatory Visit (HOSPITAL_COMMUNITY): Payer: Self-pay

## 2014-06-29 LAB — COMPREHENSIVE METABOLIC PANEL
ALT: 30 U/L (ref 0–53)
AST: 36 U/L (ref 0–37)
Albumin: 2.9 g/dL — ABNORMAL LOW (ref 3.5–5.2)
Alkaline Phosphatase: 265 U/L — ABNORMAL HIGH (ref 39–117)
Anion gap: 8 (ref 5–15)
BUN: 10 mg/dL (ref 6–23)
CALCIUM: 9.2 mg/dL (ref 8.4–10.5)
CO2: 25 mmol/L (ref 19–32)
Chloride: 102 mEq/L (ref 96–112)
Creatinine, Ser: 0.55 mg/dL (ref 0.50–1.35)
GLUCOSE: 125 mg/dL — AB (ref 70–99)
POTASSIUM: 3.7 mmol/L (ref 3.5–5.1)
SODIUM: 135 mmol/L (ref 135–145)
Total Bilirubin: 0.5 mg/dL (ref 0.3–1.2)
Total Protein: 6.6 g/dL (ref 6.0–8.3)

## 2014-06-29 LAB — CBC
HCT: 32.1 % — ABNORMAL LOW (ref 39.0–52.0)
HEMOGLOBIN: 10.2 g/dL — AB (ref 13.0–17.0)
MCH: 29.8 pg (ref 26.0–34.0)
MCHC: 31.8 g/dL (ref 30.0–36.0)
MCV: 93.9 fL (ref 78.0–100.0)
Platelets: 191 10*3/uL (ref 150–400)
RBC: 3.42 MIL/uL — AB (ref 4.22–5.81)
RDW: 13.4 % (ref 11.5–15.5)
WBC: 7.4 10*3/uL (ref 4.0–10.5)

## 2014-06-29 MED ORDER — FENTANYL CITRATE 0.05 MG/ML IJ SOLN
INTRAMUSCULAR | Status: DC | PRN
  Administered 2014-06-29 (×3): 25 ug via INTRAVENOUS

## 2014-06-29 MED ORDER — SODIUM CHLORIDE 0.9 % IV SOLN
INTRAVENOUS | Status: AC | PRN
  Administered 2014-06-29: 10 mL/h via INTRAVENOUS

## 2014-06-29 MED ORDER — IOHEXOL 300 MG/ML  SOLN
50.0000 mL | Freq: Once | INTRAMUSCULAR | Status: AC | PRN
  Administered 2014-06-29: 20 mL via INTRAVENOUS

## 2014-06-29 MED ORDER — GLUCAGON HCL RDNA (DIAGNOSTIC) 1 MG IJ SOLR
INTRAMUSCULAR | Status: AC
  Filled 2014-06-29: qty 1

## 2014-06-29 MED ORDER — GLUCAGON HCL RDNA (DIAGNOSTIC) 1 MG IJ SOLR
INTRAMUSCULAR | Status: AC | PRN
  Administered 2014-06-29: 1 mg via INTRAVENOUS

## 2014-06-29 MED ORDER — MIDAZOLAM HCL 2 MG/2ML IJ SOLN
INTRAMUSCULAR | Status: AC | PRN
  Administered 2014-06-29 (×3): 0.5 mg via INTRAVENOUS

## 2014-06-29 MED ORDER — MIDAZOLAM HCL 2 MG/2ML IJ SOLN
INTRAMUSCULAR | Status: AC
  Filled 2014-06-29: qty 4

## 2014-06-29 MED ORDER — FENTANYL CITRATE 0.05 MG/ML IJ SOLN
INTRAMUSCULAR | Status: AC
  Filled 2014-06-29: qty 4

## 2014-06-29 MED ORDER — LIDOCAINE HCL 1 % IJ SOLN
INTRAMUSCULAR | Status: AC
  Filled 2014-06-29: qty 20

## 2014-06-29 NOTE — Sedation Documentation (Signed)
Cefazolin 2 gms hung IVPB

## 2014-06-29 NOTE — Procedures (Signed)
Interventional Radiology Procedure Note  US survey for ascites: Negative for ascites Procedure: Placement of a percutaneous, pull through Gastrostomy tube.  Extra T-Tacks placed at the time of placement for increased apposition to the body wall.   Complications: No immediate  Recommendations:  - Do not use the G-tube for 1 week (7 days).  This will allow the tract to mature and decrease chance of gastric contents escaping into the peritoneum.   - If ascites develops over the next week, would consider a drainage tube to "dry" the peritoneum before using the g-tube.   - Suggest either post-pyloric feeding or TPN until G-tube can be used.  - Suggest decompression of stomach lumen to decrease chance of gastric secretions entering the peritoneal cavity.    Signed,  Yvone NeuJaime S. Loreta AveWagner, DO

## 2014-06-29 NOTE — Consult Note (Signed)
Chief Complaint: Protein calorie malnutrition  Referring Physician(s): [facility]  History of Present Illness:  Paul Scott is a 48 y.o. male  Polysubstance abuser--heroin and alcohol Alcoholic cirrhosis Par[facility] 4 liters per note (end of November) Found down in yard Cardiac arrest On vent and sent to [facility] for management Now with trach Protein calorie malnutrition Wt loss Need for long term care Request for percutaneous gastric tube placement Dr Miles CostainShick has reviewed imaging and approved pt for procedure I have seen and examined pt Low grade temps--neurogenic per MD Wbc always wnl Pancultures neg  Past Medical History  Diagnosis Date  . Alcohol abuse   . Drug abuse     heroin  . Cirrhosis   . Portal hypertension     No past surgical history on file.  Allergies: Review of patient's allergies indicates no known allergies.  Medications: Prior to Admission medications   Not on File    No family history on file.  History   Social History  . Marital Status: Married    Spouse Name: N/A    Number of Children: N/A  . Years of Education: N/A   Social History Main Topics  . Smoking status: Not on file  . Smokeless tobacco: Not on file  . Alcohol Use: Not on file  . Drug Use: Not on file  . Sexual Activity: Not on file   Other Topics Concern  . Not on file   Social History Narrative  . No narrative on file    Review of Systems: A 12 point ROS discussed and pertinent positives are indicated in the HPI above.  All other systems are negative.  Review of Systems  Constitutional: Positive for fever.       Low grade temps Wbc always wnl Pan cx neg  Respiratory:       Trach  Gastrointestinal:       NG tube  Psychiatric/Behavioral: Positive for agitation.    Vital Signs: There were no vitals taken for this visit.  Physical Exam  Cardiovascular: Normal rate.   No murmur heard. Pulmonary/Chest: Effort normal.  He has wheezes.  Abdominal: Soft. Bowel sounds are normal. There is tenderness.  Musculoskeletal: Normal range of motion.  Skin: Skin is warm and dry.  Psychiatric:  Consent in chart ---wife   Nursing note and vitals reviewed.   Imaging: Dg Abd 1 View  06/09/2014   CLINICAL DATA:  Nasogastric tube placement for feeding and suction.  EXAM: ABDOMEN - 1 VIEW  COMPARISON:  06/06/2014  FINDINGS: Single fluoroscopic exposure shows nasogastric tube enters the stomach and passes along the greater curvature to the region of the antrum. 0 min 48 seconds fluoroscopy time utilized for placement by the technologist.  IMPRESSION: Nasogastric tube within the stomach with the tip at the antrum.   Electronically Signed   By: Paulina FusiMark  Shogry M.D.   On: 06/09/2014 12:32   Ct Head Wo Contrast  06/16/2014   CLINICAL DATA:  Cardiac arrest today.  Patient is intubated.  EXAM: CT HEAD WITHOUT CONTRAST  TECHNIQUE: Contiguous axial images were obtained from the base of the skull through the vertex without intravenous contrast.  COMPARISON:  06/05/2014  FINDINGS: Diffuse cerebral atrophy. Low-attenuation changes in the deep white matter consistent with small vessel ischemia. Mild ventricular dilatation consistent with some central atrophy. No mass effect or midline shift. No abnormal extra-axial fluid collections. Gray-white matter junctions are distinct. Basal cisterns are not effaced. No evidence of acute  intracranial hemorrhage. No depressed skull fractures. Opacification of the left maxillary antrum. Mucous or retention cysts in the sphenoid sinus and left maxillary antrum. Right nasal catheter and endotracheal tubes are demonstrated. Mastoid air cells are not opacified.  IMPRESSION: No acute intracranial abnormalities. Chronic atrophy and small vessel ischemic changes. Inflammatory changes suggested in the paranasal sinuses.   Electronically Signed   By: Burman Nieves M.D.   On: 06/16/2014 01:39   US Abdomen  Complete  06/10/2014   CLINICAL DATA:  Known hepatic cirrhosis. Assess for ascites and assess liver. Subsequent encounter.  EXAM: ULTRASOUND ABDOMEN COMPLETE  COMPARISON:  Right upper quadrant abdominal ultrasound performed 11/26/2013, and CT of the abdomen and pelvis performed 02/21/2014  FINDINGS: Gallbladder: Mildly echogenic sludge is noted within the gallbladder. The gallbladder is otherwise unremarkable in appearance. No significant gallbladder wall thickening or pericholecystic fluid is seen. No ultrasonographic Murphy's sign is elicited.  Common bile duct: Diameter: 0.5 cm, within normal limits in caliber  Liver: No focal lesion identified. Mildly increased echogenicity and coarsened echotexture, compatible with fatty infiltration. There is a nodular contour to the liver, reflecting known cirrhotic change.  IVC: No abnormality visualized.  Pancreas: Visualized portion unremarkable.  Spleen: Size and appearance within normal limits.  Right Kidney: Length: 11.0 cm. Echogenicity within normal limits. No mass or hydronephrosis visualized.  Left Kidney: Length: 12.0 cm. Echogenicity within normal limits. No mass or hydronephrosis visualized.  Abdominal aorta: No aneurysm visualized.  Other findings: None.  IMPRESSION: 1. No ascites seen. 2. Findings of hepatic cirrhosis again noted. Fatty infiltration within the liver. 3. Mildly echogenic sludge noted in the gallbladder; gallbladder otherwise unremarkable in appearance.   Electronically Signed   By: Roanna Raider M.D.   On: 06/10/2014 03:38   Dg Chest Port 1 View  06/22/2014   CLINICAL DATA:  Respiratory failure  EXAM: PORTABLE CHEST - 1 VIEW  COMPARISON:  June 21, 2014  FINDINGS: Tracheostomy catheter tip is 4.4 cm above the carina. Nasogastric tube tip and side port are below the diaphragm. Central catheter tip is in the right atrium near the cavoatrial junction. No pneumothorax. No edema or consolidation. Heart size and pulmonary vascularity are  normal. No adenopathy.  IMPRESSION: Tube and catheter positions as described without pneumothorax. No edema or consolidation.   Electronically Signed   By: Bretta Bang M.D.   On: 06/22/2014 07:47   Dg Chest Port 1 View  06/21/2014   CLINICAL DATA:  48 year old male presenting with shortness of breath and respiratory failure.  EXAM: PORTABLE CHEST - 1 VIEW  COMPARISON:  Chest x-ray 06/20/2014.  FINDINGS: Previously noted endotracheal tube has been removed, and there is a new tracheostomy tube in position which appears properly located, with the tip approximately 3.7 cm above the carina. A nasogastric tube is seen extending into the stomach, however, the tip of the nasogastric tube extends below the lower margin of the image. Right upper extremity PICC with tip terminating at the superior cavoatrial junction is unchanged. Lung volumes are low. No consolidative airspace disease. No pleural effusion. No pneumothorax. No evidence of pulmonary edema. Heart size and mediastinal contours are within normal limits.  IMPRESSION: 1. Support apparatus, as above. 2. Low lung volumes without radiographic evidence of acute cardiopulmonary disease.   Electronically Signed   By: Trudie Reed M.D.   On: 06/21/2014 12:14   Dg Chest Port 1 View  06/20/2014   CLINICAL DATA:  Respiratory failure.  EXAM: PORTABLE CHEST - 1 VIEW  COMPARISON:  06/19/2014.  FINDINGS: Tracheostomy tube, NG tube, right PICC line stable position. Mediastinum and hilar structures normal. Lungs are clear. Mild subsegmental atelectasis left lung base. Interim clearing of right mid lung field subsegmental atelectasis. No pleural effusion or pneumothorax. No acute osseus abnormality.  IMPRESSION: 1. Lines and tubes in stable position. 2. Mild subsegmental atelectasis left lung base. Interval clearing of right mid lung field atelectasis.   Electronically Signed   By: Maisie Fus  Register   On: 06/20/2014 08:14   Dg Chest Port 1 View  06/19/2014    CLINICAL DATA:  Endotracheal tube placement.  EXAM: PORTABLE CHEST - 1 VIEW  COMPARISON:  06/16/2014  FINDINGS: Endotracheal tube tip 3.2 cm above the carina. Right central line tip: Lower SVC.  Low lung volumes. Cardiac and mediastinal margins appear normal. Right mid lung subsegmental atelectasis.  IMPRESSION: 1. Tubes and lines appear satisfactorily position. 2. Low lung volumes.  Mild right mid lung subsegmental atelectasis.   Electronically Signed   By: Herbie Baltimore M.D.   On: 06/19/2014 21:34   Dg Chest Port 1 View  06/16/2014   CLINICAL DATA:  Respiratory failure.  EXAM: PORTABLE CHEST - 1 VIEW  COMPARISON:  06/15/2014 .  FINDINGS: Endotracheal tube, NG tube, right IJ line in stable position. The stomach hilar structures normal. Lungs are clear. No pleural effusion or pneumothorax. Right anterior second rib fracture.  IMPRESSION: 1. Lines and tubes in stable position. 2. No acute cardiopulmonary disease. 3. Right anterior second rib fracture.  No pneumothorax.   Electronically Signed   By: Maisie Fus  Register   On: 06/16/2014 08:12   Dg Chest Port 1 View  06/15/2014   CLINICAL DATA:  Code blue. Endotracheal tube placement. Unresponsive.  EXAM: PORTABLE CHEST - 1 VIEW  COMPARISON:  06/14/2014  FINDINGS: Endotracheal tube tip measures 6.6 cm above the carinal. Enteric tube tip is off the field of view but below the left hemidiaphragm right central venous catheter tip over the lower SVC region. No pneumothorax. Shallow inspiration. Heart size and pulmonary vascularity are normal. No focal airspace disease or consolidation in the lungs. No pneumothorax. Probable fracture of the anterior right second rib.  IMPRESSION: Appliances appear in satisfactory position. Lungs are clear. Probable right second anterior rib fracture.   Electronically Signed   By: Burman Nieves M.D.   On: 06/15/2014 06:56   Dg Chest Port 1 View  06/14/2014   CLINICAL DATA:  History pneumonia  EXAM: PORTABLE CHEST - 1 VIEW   COMPARISON:  Chest x-ray of 06/12/2014  FINDINGS: There is mild atelectasis in the lung bases but no definite infiltrate or pleural effusion is seen. Linear atelectasis is noted more prominently at the right lung base. A right central venous line tip overlies the expected SVC -RA junction. An NG tube is present extending into the stomach. The heart size is within normal limits.  IMPRESSION: Bibasilar atelectasis.  No definite pneumonia or effusion.   Electronically Signed   By: Dwyane Dee M.D.   On: 06/14/2014 14:34   Dg Chest Port 1 View  06/12/2014   CLINICAL DATA:  48 year old male with diagnosis of pneumonia  EXAM: PORTABLE CHEST - 1 VIEW  COMPARISON:  Prior chest x-ray 06/08/2014  FINDINGS: A nasogastric tube has been placed. The tip of the tube lies off the field of view but can be identified in the region of the proximal descending duodenum. There is a right upper extremity approach PICC. The catheter tip is in good position  at the superior cavoatrial junction. Inspiratory volumes remain low and there is mild bibasilar atelectasis. No focal infiltrate, pleural effusion or pneumothorax. Cardiac and mediastinal contours remain unchanged. No acute osseous abnormality.  IMPRESSION: 1. Low inspiratory volumes with bibasilar atelectasis. 2. Although incompletely imaged, the tip of the nasogastric tube can be seen as far distally as the proximal duodenum. 3. Right upper extremity PICC remains in good position with the tip overlying the superior cavoatrial junction.   Electronically Signed   By: Malachy Moan M.D.   On: 06/12/2014 08:12   Dg Chest Port 1 View  06/08/2014   CLINICAL DATA:  PICC line placement  EXAM: PORTABLE CHEST - 1 VIEW  COMPARISON:  06/08/2014  FINDINGS: A new right arm PICC line is seen with tip overlying the superior cavoatrial junction. Both lungs are clear. Heart size is normal.  IMPRESSION: PICC line tip overlying the superior cavoatrial junction. No active disease.    Electronically Signed   By: Myles Rosenthal M.D.   On: 06/08/2014 18:49   Dg Chest Port 1 View  06/08/2014   CLINICAL DATA:  Pneumonia.  EXAM: PORTABLE CHEST - 1 VIEW  COMPARISON:  06/05/2014.  FINDINGS: Mediastinal structures are normal. Heart size and pulmonary vascularity normal. Stable mild left base subsegmental atelectasis. No pleural effusion or pneumothorax. No acute osseous abnormality.  IMPRESSION: Stable mild left base subsegmental atelectasis.   Electronically Signed   By: Maisie Fus  Register   On: 06/08/2014 07:47   Dg Basil Dess Tube Plc W/fl-no Rad  06/09/2014   CLINICAL DATA:    NASO G TUBE PLACEMENT WITH FLUORO  Fluoroscopy was utilized by the requesting physician.  No radiographic  interpretation.     Labs:  CBC:  Recent Labs  06/21/14 0147 06/22/14 0750 06/26/14 0500 06/29/14 0400  WBC 6.7 7.9 7.0 7.4  HGB 10.4* 9.0* 9.9* 10.2*  HCT 32.5* 28.8* 31.3* 32.1*  PLT 170 168 173 191    COAGS:  Recent Labs  06/08/14 0815 06/20/14 0500 06/21/14 0147  INR 1.19 1.36 1.18  APTT 31  --   --     BMP:  Recent Labs  06/21/14 0147 06/22/14 0750 06/26/14 0500 06/29/14 0400  NA 139 135* 138 135  K 3.7 3.9 4.0 3.7  CL 100 100 99 102  CO2 28 22 26 25   GLUCOSE 98 119* 116* 125*  BUN 22 12 12 10   CALCIUM 9.2 8.6 9.2 9.2  CREATININE 0.62 0.49* 0.43* 0.55  GFRNONAA >90 >90 >90 >90  GFRAA >90 >90 >90 >90    LIVER FUNCTION TESTS:  Recent Labs  06/15/14 1110 06/20/14 0500 06/26/14 0500 06/29/14 0400  BILITOT 0.5 0.5 0.5 0.5  AST 44* 34 44* 36  ALT 17 27 41 30  ALKPHOS 205* 218* 368* 265*  PROT 6.4 6.5 6.9 6.6  ALBUMIN 2.6* 2.4* 2.8* 2.9*    TUMOR MARKERS: No results for input(s): AFPTM, CEA, CA199, CHROMGRNA in the last 8760 hours.  Assessment and Plan:  Cardiac arrest secondary polysubstance abuse Vent now trach PCM Long term care Scheduled for percutaneous gastric tube placement in IR pts wife aware of procedure benefits and risks and agreeable to  proceed Consent signed and in chart Known hx ascites---Rad to evaluate prior to G tube placement  Thank you for this interesting consult.  I greatly enjoyed meeting Paul Scott and look forward to participating in their care.     I spent a total of 40 minutes face to face in clinical  consultation, greater than 50% of which was counseling/coordinating care for perc G tube in IR  Signed: Janique Hoefer A 06/29/2014, 7:50 AM

## 2014-07-01 LAB — BASIC METABOLIC PANEL
Anion gap: 7 (ref 5–15)
BUN: 9 mg/dL (ref 6–23)
CHLORIDE: 100 meq/L (ref 96–112)
CO2: 26 mmol/L (ref 19–32)
Calcium: 9.2 mg/dL (ref 8.4–10.5)
Creatinine, Ser: 0.51 mg/dL (ref 0.50–1.35)
GFR calc Af Amer: >90 mL/min (ref >90)
GFR calc non Af Amer: >90 mL/min (ref >90)
GLUCOSE: 117 mg/dL — AB (ref 70–99)
POTASSIUM: 3.7 mmol/L (ref 3.5–5.1)
Sodium: 133 mmol/L — ABNORMAL LOW (ref 135–145)

## 2014-07-01 LAB — PHOSPHORUS: PHOSPHORUS: 4 mg/dL (ref 2.3–4.6)

## 2014-07-01 LAB — PROTIME-INR
INR: 1.16 (ref 0.00–1.49)
PROTHROMBIN TIME: 15 s (ref 11.6–15.2)

## 2014-07-01 LAB — MAGNESIUM: Magnesium: 1.6 mg/dL (ref 1.5–2.5)

## 2014-07-05 DIAGNOSIS — J9601 Acute respiratory failure with hypoxia: Secondary | ICD-10-CM | POA: Diagnosis not present

## 2014-07-05 DIAGNOSIS — Z930 Tracheostomy status: Secondary | ICD-10-CM | POA: Diagnosis not present

## 2014-07-05 NOTE — Progress Notes (Signed)
   Name: Paul Scott MRN: [id number] DOB: 10/15/1965    ADMISSION DATE:  06/07/2014 CONSULTATION DATE:  06/15/14  REFERRING MD :  Dr. Sharyon MedicusHijazi   CHIEF COMPLAINT:  Cardiopulmonary arrest  HISTORY OF PRESENT ILLNESS: 48 y/o M with PMH of polysubstance abuse previously admitted [facility]/14 after being found unresponsive in his yard.  He suffered a PEA arrest in the ER and was admitted to the ICU.  Arrest was thought to be related to substance abuse.  Hospital work up at that time was positive for cirrhosis with portal hypertension and ascites.  He underwent paracentesis with 4L of fluid removed.  The patient was extubated and moved to SDU where he developed DT's and severe encephalopathy.  He was treated with IV benzo's which did not improve patient symptoms.  He was evaluated by psychiatry and placed on phenobarbitol.  Hospital course complicated by tachycardia, hypertension, and c-diff colitis (dx on 06/01/14.  He was treated with Flagyl (planned stop date 06/15/14).  Per notes, the patient was awake, alert and able to take pills.  [facility] [place] [facility].  On 12/10, the patient suffered an bradycardic arrest and was intubated.  PCCM consulted for evaluation.  Per [facility] report, the patient has not been awake / oriented since admission.    SIGNIFICANT EVENTS  11/23  Admit to [facility] with [facility], intubated 12/02  [facility], on phenobarbitol  12/10  Bradycardic arrest, intubated  12/14 planned extubated @ 12 n. Failed with reintubation 12/15 12/16 trached DF>> 12/17 liberated from vent 12/24 PEG - IR   STUDIES:  12/10  CT Head >> neg    SUBJECTIVE: tolerates ATc No dyspnea Secretions mild  VITAL SIGNS:  Vital signs reviewed. Abnormal values will appear under impression plan section.     PHYSICAL EXAMINATION: General:  Thin adult male in NAD on t collar Neuro: Follows commands,  MAEx 4 HEENT:  Trach CDI  Cardiovascular:  s1s2 rrr,  no m/r/g  Lungs:  resp's even/non-labored, lungs bilaterally diminished , mild secretions   Abdomen:  Round/soft, bsx4 active ,FT rt nare Musculoskeletal:  No acute deformities  Skin:  Warm/dry, multiple tattoos   Recent Labs Lab 06/29/14 0400 07/01/14 0130  NA 135 133*  K 3.7 3.7  CL 102 100  CO2 25 26  BUN 10 9  CREATININE 0.55 0.51  GLUCOSE 125* 117*    Recent Labs Lab 06/29/14 0400  HGB 10.2*  HCT 32.1*  WBC 7.4  PLT 191   No results found.  ASSESSMENT / PLAN:   Respiratory Arrest - bradycardia then arrest on 12/10, required intubation. Failed extubation attempt 12/14 reintubated and trached 12/16 and liberated from vent 12/17 R 2nd Rib Fracture - post CPR  Polysubstance Abuse  Encephalopathy on phenobarb Szs post code with negative head CT C-Diff Colitis  Cirrhosis with Portal Hypertension & Ascites   Plan: BD ABX per primary   Leave trach in until mental status has improved. He has 2 Resp/cardiac arrests. He is tolerating trach collar trials and is able to manage his secretions. Would need significant improvement in his mental status and conditioning for us to consider decannulation eventually.  PCCM to sign off    07/05/2014, 1:04 PM    Oretha MilchALVA,RAKESH V. MD

## 2014-07-06 LAB — CLOSTRIDIUM DIFFICILE BY PCR: CDIFFPCR: NEGATIVE

## 2014-07-09 LAB — CBC
HCT: 30.3 % — ABNORMAL LOW (ref 39.0–52.0)
Hemoglobin: 9.7 g/dL — ABNORMAL LOW (ref 13.0–17.0)
MCH: 29.7 pg (ref 26.0–34.0)
MCHC: 32 g/dL (ref 30.0–36.0)
MCV: 92.7 fL (ref 78.0–100.0)
Platelets: 136 10*3/uL — ABNORMAL LOW (ref 150–400)
RBC: 3.27 MIL/uL — ABNORMAL LOW (ref 4.22–5.81)
RDW: 13 % (ref 11.5–15.5)
WBC: 4.1 10*3/uL (ref 4.0–10.5)

## 2014-07-09 LAB — BASIC METABOLIC PANEL
ANION GAP: 4 — AB (ref 5–15)
BUN: 13 mg/dL (ref 6–23)
CO2: 29 mmol/L (ref 19–32)
Calcium: 9.1 mg/dL (ref 8.4–10.5)
Chloride: 101 mEq/L (ref 96–112)
Creatinine, Ser: 0.51 mg/dL (ref 0.50–1.35)
GFR calc non Af Amer: >90 mL/min (ref >90)
Glucose, Bld: 83 mg/dL (ref 70–99)
Potassium: 4.2 mmol/L (ref 3.5–5.1)
Sodium: 134 mmol/L — ABNORMAL LOW (ref 135–145)

## 2014-07-12 LAB — CBC WITH DIFFERENTIAL/PLATELET
BASOS ABS: 0 10*3/uL (ref 0.0–0.1)
Basophils Relative: 0 % (ref 0–1)
EOS ABS: 0.2 10*3/uL (ref 0.0–0.7)
Eosinophils Relative: 3 % (ref 0–5)
HCT: 30.4 % — ABNORMAL LOW (ref 39.0–52.0)
HEMOGLOBIN: 9.9 g/dL — AB (ref 13.0–17.0)
Lymphocytes Relative: 27 % (ref 12–46)
Lymphs Abs: 1.2 10*3/uL (ref 0.7–4.0)
MCH: 30.1 pg (ref 26.0–34.0)
MCHC: 32.6 g/dL (ref 30.0–36.0)
MCV: 92.4 fL (ref 78.0–100.0)
MONOS PCT: 14 % — AB (ref 3–12)
Monocytes Absolute: 0.6 10*3/uL (ref 0.1–1.0)
Neutro Abs: 2.6 10*3/uL (ref 1.7–7.7)
Neutrophils Relative %: 56 % (ref 43–77)
Platelets: 134 10*3/uL — ABNORMAL LOW (ref 150–400)
RBC: 3.29 MIL/uL — ABNORMAL LOW (ref 4.22–5.81)
RDW: 13 % (ref 11.5–15.5)
WBC: 4.7 10*3/uL (ref 4.0–10.5)

## 2014-07-12 LAB — COMPREHENSIVE METABOLIC PANEL
ALBUMIN: 3.1 g/dL — AB (ref 3.5–5.2)
ALT: 21 U/L (ref 0–53)
ANION GAP: 10 (ref 5–15)
AST: 33 U/L (ref 0–37)
Alkaline Phosphatase: 199 U/L — ABNORMAL HIGH (ref 39–117)
BUN: 14 mg/dL (ref 6–23)
CALCIUM: 9.3 mg/dL (ref 8.4–10.5)
CO2: 25 mmol/L (ref 19–32)
Chloride: 101 mEq/L (ref 96–112)
Creatinine, Ser: 0.56 mg/dL (ref 0.50–1.35)
GFR calc Af Amer: >90 mL/min (ref >90)
Glucose, Bld: 119 mg/dL — ABNORMAL HIGH (ref 70–99)
POTASSIUM: 3.8 mmol/L (ref 3.5–5.1)
Sodium: 136 mmol/L (ref 135–145)
TOTAL PROTEIN: 6.6 g/dL (ref 6.0–8.3)
Total Bilirubin: 0.7 mg/dL (ref 0.3–1.2)

## 2014-07-12 LAB — MAGNESIUM: MAGNESIUM: 1.5 mg/dL (ref 1.5–2.5)

## 2014-07-12 LAB — PHOSPHORUS: PHOSPHORUS: 4.5 mg/dL (ref 2.3–4.6)

## 2014-07-16 LAB — BASIC METABOLIC PANEL
ANION GAP: 5 (ref 5–15)
BUN: 12 mg/dL (ref 6–23)
CALCIUM: 9.4 mg/dL (ref 8.4–10.5)
CO2: 28 mmol/L (ref 19–32)
Chloride: 105 mEq/L (ref 96–112)
Creatinine, Ser: 0.48 mg/dL — ABNORMAL LOW (ref 0.50–1.35)
GFR calc Af Amer: >90 mL/min (ref >90)
GLUCOSE: 143 mg/dL — AB (ref 70–99)
Potassium: 3.4 mmol/L — ABNORMAL LOW (ref 3.5–5.1)
Sodium: 138 mmol/L (ref 135–145)

## 2014-07-17 LAB — COMPREHENSIVE METABOLIC PANEL
ALK PHOS: 142 U/L — AB (ref 39–117)
ALT: 27 U/L (ref 0–53)
AST: 35 U/L (ref 0–37)
Albumin: 3 g/dL — ABNORMAL LOW (ref 3.5–5.2)
Anion gap: 7 (ref 5–15)
BUN: 15 mg/dL (ref 6–23)
CO2: 26 mmol/L (ref 19–32)
CREATININE: 0.48 mg/dL — AB (ref 0.50–1.35)
Calcium: 9.2 mg/dL (ref 8.4–10.5)
Chloride: 105 mEq/L (ref 96–112)
GFR calc non Af Amer: >90 mL/min (ref >90)
Glucose, Bld: 112 mg/dL — ABNORMAL HIGH (ref 70–99)
Potassium: 4 mmol/L (ref 3.5–5.1)
Sodium: 138 mmol/L (ref 135–145)
TOTAL PROTEIN: 6.4 g/dL (ref 6.0–8.3)
Total Bilirubin: 0.4 mg/dL (ref 0.3–1.2)

## 2014-07-17 LAB — CBC
HCT: 31.9 % — ABNORMAL LOW (ref 39.0–52.0)
Hemoglobin: 10 g/dL — ABNORMAL LOW (ref 13.0–17.0)
MCH: 29 pg (ref 26.0–34.0)
MCHC: 31.3 g/dL (ref 30.0–36.0)
MCV: 92.5 fL (ref 78.0–100.0)
PLATELETS: 121 10*3/uL — AB (ref 150–400)
RBC: 3.45 MIL/uL — AB (ref 4.22–5.81)
RDW: 13 % (ref 11.5–15.5)
WBC: 6.5 10*3/uL (ref 4.0–10.5)

## 2014-07-17 LAB — MAGNESIUM: Magnesium: 1.7 mg/dL (ref 1.5–2.5)

## 2014-07-18 ENCOUNTER — Other Ambulatory Visit (HOSPITAL_COMMUNITY): Payer: Self-pay

## 2014-07-18 LAB — CLOSTRIDIUM DIFFICILE BY PCR: Toxigenic C. Difficile by PCR: NEGATIVE

## 2014-07-19 DIAGNOSIS — G92 Toxic encephalopathy: Secondary | ICD-10-CM

## 2014-07-19 DIAGNOSIS — G929 Unspecified toxic encephalopathy: Secondary | ICD-10-CM

## 2014-07-19 DIAGNOSIS — Z930 Tracheostomy status: Secondary | ICD-10-CM

## 2014-07-19 DIAGNOSIS — F1910 Other psychoactive substance abuse, uncomplicated: Secondary | ICD-10-CM

## 2014-07-19 DIAGNOSIS — J9601 Acute respiratory failure with hypoxia: Secondary | ICD-10-CM | POA: Diagnosis not present

## 2014-07-19 NOTE — Consult Note (Signed)
Name: Paul Scott MRN: [id number] DOB: 05/17/1966    ADMISSION DATE:  06/07/2014 CONSULTATION DATE: 06/15/14  REFERRING MD : Dr. Sharyon MedicusHijazi   CHIEF COMPLAINT: Cardiopulmonary arrest  HISTORY OF PRESENT ILLNESS: 49 y/o M with PMH of polysubstance abuse [facility] after being found unresponsive in his yard. He suffered a PEA arrest in the ER and was admitted to the ICU. Arrest was thought to be related to substance abuse. Hospital work up at that time was positive for cirrhosis with portal hypertension and ascites. He underwent paracentesis with 4L of fluid removed. The patient was extubated and moved to SDU where he developed DT's and severe encephalopathy. He was treated with IV benzo's which did not improve patient symptoms. He was evaluated by psychiatry and placed on phenobarbitol. Hospital course complicated by tachycardia, hypertension, and c-diff colitis (dx on 06/01/14. He was treated with Flagyl (planned stop date 06/15/14). Per notes, the patient was awake, alert and able to take pills. [facility] was [name] [name] on 06/07/09. On 12/10, the patient suffered an bradycardic arrest and was intubated. PCCM consulted for evaluation. Per [name] report, the patient has not been awake / oriented since admission.   SIGNIFICANT EVENTS  11/23 [name], intubated 12/02 [name], on phenobarbitol  12/10 Bradycardic arrest, intubated  12/14 planned extubated @ 12 n. Failed with reintubation 12/15 12/16 trached DF>>1/13 12/17 liberated from vent 12/24 PEG - IR 1/13 PCCM re contacted for decannulation.  STUDIES:  12/10 CT Head >> neg SUBJECTIVE:  NAD VITAL SIGNS: Vital signs reviewed. Abnormal values will appear under impression plan section.   PHYSICAL EXAMINATION: General: Thin wm, no follows commands Neuro:  No follows commands, mild contractures HEENT:  Trach with minimal  secretions Cardiovascular:  HSR RRR Lungs:  CTA Abdomen:  Peg Musculoskeletal:  Contracted  Skin:  warm   Recent Labs Lab 07/16/14 0500 07/17/14 0500  NA 138 138  K 3.4* 4.0  CL 105 105  CO2 28 26  BUN 12 15  CREATININE 0.48* 0.48*  GLUCOSE 143* 112*    Recent Labs Lab 07/17/14 0500  HGB 10.0*  HCT 31.9*  WBC 6.5  PLT 121*   Dg Chest Port 1 View  07/18/2014   CLINICAL DATA:  Respiratory failure, history of cirrhosis and previous CVA  EXAM: PORTABLE CHEST - 1 VIEW  COMPARISON:  Portable chest x-ray of June 22, 2014.  FINDINGS: The lungs are reasonably well inflated. The tracheostomy appliance tip lies at the level of the clavicular heads. There is no pleural effusion. The heart is top-normal in size. The pulmonary vascularity is not engorged. There is a right-sided PICC line whose tip lies at the level of the cavoatrial junction and is unchanged. There is a right internal jugular venous catheter whose tip projects across the midline in the medial aspect of the left brachiocephalic vein. The bony thorax exhibits no acute abnormality.  IMPRESSION: Support tubes and lines in place as described. There is no evidence of pneumonia, CHF, nor other acute cardiopulmonary abnormality.   Electronically Signed   By: David  Swazil[place]Jordan   On: 07/18/2014 07:40    ASSESSMENT    Active Problems:    Tracheostomy dependent   Cirrhosis   Acute respiratory failure with hypoxia   CVA (cerebral infarction)    Encephalopathy, toxic   Polysubstance abuse  PLAN: Discussion: 49 yo who has been tracheostomy dependent  Post resp arrests from  polysubstance abuse and toxic encephalopathy. PCCM signed off 07/05/14 and is asked to revisit today for possible decannulation. His secretions are minimal and I suspect he will tolerate decannulation.  DC trach if fails will replace trach 1/13.  Brett Canales Minor ACNP [facility] P[facility]M Pager [phone] till 3 pm If no answer page [phone] 07/19/2014, 1:47 PM    Attending Note:  I have examined patient, reviewed labs, studies and notes. I have discussed the case with S Minor, and I agree with the data and plans as amended above.   Levy Pupa, MD, PhD 07/19/2014, 3:47 PM [facility] [phone] or if no answer [phone]

## 2014-08-02 ENCOUNTER — Inpatient Hospital Stay (HOSPITAL_COMMUNITY)
Admission: AD | Admit: 2014-08-02 | Discharge: 2014-09-05 | DRG: 870 | Disposition: E | Payer: Medicare Other | Source: Other Acute Inpatient Hospital | Attending: Pulmonary Disease | Admitting: Pulmonary Disease

## 2014-08-02 ENCOUNTER — Inpatient Hospital Stay (HOSPITAL_COMMUNITY): Payer: Medicare Other

## 2014-08-02 DIAGNOSIS — Y95 Nosocomial condition: Secondary | ICD-10-CM | POA: Diagnosis not present

## 2014-08-02 DIAGNOSIS — K766 Portal hypertension: Secondary | ICD-10-CM | POA: Diagnosis present

## 2014-08-02 DIAGNOSIS — R739 Hyperglycemia, unspecified: Secondary | ICD-10-CM | POA: Diagnosis not present

## 2014-08-02 DIAGNOSIS — Z8674 Personal history of sudden cardiac arrest: Secondary | ICD-10-CM

## 2014-08-02 DIAGNOSIS — J9621 Acute and chronic respiratory failure with hypoxia: Secondary | ICD-10-CM | POA: Diagnosis present

## 2014-08-02 DIAGNOSIS — R451 Restlessness and agitation: Secondary | ICD-10-CM | POA: Diagnosis present

## 2014-08-02 DIAGNOSIS — E872 Acidosis: Secondary | ICD-10-CM | POA: Diagnosis not present

## 2014-08-02 DIAGNOSIS — I213 ST elevation (STEMI) myocardial infarction of unspecified site: Secondary | ICD-10-CM | POA: Diagnosis present

## 2014-08-02 DIAGNOSIS — D696 Thrombocytopenia, unspecified: Secondary | ICD-10-CM | POA: Diagnosis present

## 2014-08-02 DIAGNOSIS — J9690 Respiratory failure, unspecified, unspecified whether with hypoxia or hypercapnia: Secondary | ICD-10-CM

## 2014-08-02 DIAGNOSIS — Z515 Encounter for palliative care: Secondary | ICD-10-CM | POA: Diagnosis not present

## 2014-08-02 DIAGNOSIS — R740 Nonspecific elevation of levels of transaminase and lactic acid dehydrogenase [LDH]: Secondary | ICD-10-CM | POA: Diagnosis present

## 2014-08-02 DIAGNOSIS — F1010 Alcohol abuse, uncomplicated: Secondary | ICD-10-CM | POA: Diagnosis present

## 2014-08-02 DIAGNOSIS — F1110 Opioid abuse, uncomplicated: Secondary | ICD-10-CM | POA: Diagnosis present

## 2014-08-02 DIAGNOSIS — R791 Abnormal coagulation profile: Secondary | ICD-10-CM | POA: Diagnosis not present

## 2014-08-02 DIAGNOSIS — I5042 Chronic combined systolic (congestive) and diastolic (congestive) heart failure: Secondary | ICD-10-CM | POA: Diagnosis not present

## 2014-08-02 DIAGNOSIS — D649 Anemia, unspecified: Secondary | ICD-10-CM | POA: Diagnosis not present

## 2014-08-02 DIAGNOSIS — Z931 Gastrostomy status: Secondary | ICD-10-CM | POA: Diagnosis not present

## 2014-08-02 DIAGNOSIS — G931 Anoxic brain damage, not elsewhere classified: Secondary | ICD-10-CM | POA: Diagnosis not present

## 2014-08-02 DIAGNOSIS — N390 Urinary tract infection, site not specified: Secondary | ICD-10-CM | POA: Diagnosis present

## 2014-08-02 DIAGNOSIS — Z8673 Personal history of transient ischemic attack (TIA), and cerebral infarction without residual deficits: Secondary | ICD-10-CM

## 2014-08-02 DIAGNOSIS — J690 Pneumonitis due to inhalation of food and vomit: Secondary | ICD-10-CM | POA: Diagnosis present

## 2014-08-02 DIAGNOSIS — R21 Rash and other nonspecific skin eruption: Secondary | ICD-10-CM | POA: Diagnosis not present

## 2014-08-02 DIAGNOSIS — J850 Gangrene and necrosis of lung: Secondary | ICD-10-CM | POA: Diagnosis not present

## 2014-08-02 DIAGNOSIS — J189 Pneumonia, unspecified organism: Secondary | ICD-10-CM | POA: Diagnosis not present

## 2014-08-02 DIAGNOSIS — K567 Ileus, unspecified: Secondary | ICD-10-CM | POA: Diagnosis not present

## 2014-08-02 DIAGNOSIS — J9600 Acute respiratory failure, unspecified whether with hypoxia or hypercapnia: Secondary | ICD-10-CM

## 2014-08-02 DIAGNOSIS — Z66 Do not resuscitate: Secondary | ICD-10-CM | POA: Diagnosis not present

## 2014-08-02 DIAGNOSIS — I10 Essential (primary) hypertension: Secondary | ICD-10-CM | POA: Diagnosis not present

## 2014-08-02 DIAGNOSIS — R945 Abnormal results of liver function studies: Secondary | ICD-10-CM

## 2014-08-02 DIAGNOSIS — I248 Other forms of acute ischemic heart disease: Secondary | ICD-10-CM | POA: Diagnosis present

## 2014-08-02 DIAGNOSIS — I509 Heart failure, unspecified: Secondary | ICD-10-CM | POA: Diagnosis not present

## 2014-08-02 DIAGNOSIS — J9601 Acute respiratory failure with hypoxia: Secondary | ICD-10-CM | POA: Diagnosis present

## 2014-08-02 DIAGNOSIS — A419 Sepsis, unspecified organism: Secondary | ICD-10-CM | POA: Diagnosis present

## 2014-08-02 DIAGNOSIS — R0902 Hypoxemia: Secondary | ICD-10-CM

## 2014-08-02 DIAGNOSIS — R6521 Severe sepsis with septic shock: Secondary | ICD-10-CM | POA: Diagnosis not present

## 2014-08-02 DIAGNOSIS — R0600 Dyspnea, unspecified: Secondary | ICD-10-CM | POA: Diagnosis present

## 2014-08-02 DIAGNOSIS — K7040 Alcoholic hepatic failure without coma: Secondary | ICD-10-CM | POA: Diagnosis present

## 2014-08-02 DIAGNOSIS — R7989 Other specified abnormal findings of blood chemistry: Secondary | ICD-10-CM

## 2014-08-02 DIAGNOSIS — E876 Hypokalemia: Secondary | ICD-10-CM | POA: Diagnosis not present

## 2014-08-02 DIAGNOSIS — J989 Respiratory disorder, unspecified: Secondary | ICD-10-CM

## 2014-08-02 DIAGNOSIS — K7031 Alcoholic cirrhosis of liver with ascites: Secondary | ICD-10-CM | POA: Diagnosis not present

## 2014-08-02 DIAGNOSIS — G9340 Encephalopathy, unspecified: Secondary | ICD-10-CM | POA: Diagnosis present

## 2014-08-02 DIAGNOSIS — R778 Other specified abnormalities of plasma proteins: Secondary | ICD-10-CM | POA: Diagnosis present

## 2014-08-02 DIAGNOSIS — R9431 Abnormal electrocardiogram [ECG] [EKG]: Secondary | ICD-10-CM | POA: Diagnosis present

## 2014-08-02 DIAGNOSIS — R140 Abdominal distension (gaseous): Secondary | ICD-10-CM

## 2014-08-02 DIAGNOSIS — R1310 Dysphagia, unspecified: Secondary | ICD-10-CM | POA: Diagnosis present

## 2014-08-02 LAB — PHOSPHORUS: PHOSPHORUS: 3.6 mg/dL (ref 2.3–4.6)

## 2014-08-02 LAB — POCT I-STAT 3, ART BLOOD GAS (G3+)
Acid-base deficit: 2 mmol/L (ref 0.0–2.0)
Bicarbonate: 23 mEq/L (ref 20.0–24.0)
O2 SAT: 98 %
TCO2: 24 mmol/L (ref 0–100)
pCO2 arterial: 38.9 mmHg (ref 35.0–45.0)
pH, Arterial: 7.381 (ref 7.350–7.450)
pO2, Arterial: 105 mmHg — ABNORMAL HIGH (ref 80.0–100.0)

## 2014-08-02 LAB — COMPREHENSIVE METABOLIC PANEL
ALK PHOS: 114 U/L (ref 39–117)
ALT: 32 U/L (ref 0–53)
ANION GAP: 5 (ref 5–15)
AST: 51 U/L — AB (ref 0–37)
Albumin: 2.9 g/dL — ABNORMAL LOW (ref 3.5–5.2)
BILIRUBIN TOTAL: 1.2 mg/dL (ref 0.3–1.2)
BUN: 15 mg/dL (ref 6–23)
CHLORIDE: 105 mmol/L (ref 96–112)
CO2: 27 mmol/L (ref 19–32)
CREATININE: 0.72 mg/dL (ref 0.50–1.35)
Calcium: 9 mg/dL (ref 8.4–10.5)
Glucose, Bld: 102 mg/dL — ABNORMAL HIGH (ref 70–99)
POTASSIUM: 3.7 mmol/L (ref 3.5–5.1)
Sodium: 137 mmol/L (ref 135–145)
Total Protein: 6.8 g/dL (ref 6.0–8.3)

## 2014-08-02 LAB — URINE MICROSCOPIC-ADD ON

## 2014-08-02 LAB — TROPONIN I: TROPONIN I: 0.07 ng/mL — AB (ref <0.031)

## 2014-08-02 LAB — URINALYSIS, ROUTINE W REFLEX MICROSCOPIC
Bilirubin Urine: NEGATIVE
GLUCOSE, UA: NEGATIVE mg/dL
KETONES UR: NEGATIVE mg/dL
NITRITE: POSITIVE — AB
PROTEIN: NEGATIVE mg/dL
Specific Gravity, Urine: 1.025 (ref 1.005–1.030)
UROBILINOGEN UA: 0.2 mg/dL (ref 0.0–1.0)
pH: 6 (ref 5.0–8.0)

## 2014-08-02 LAB — STREP PNEUMONIAE URINARY ANTIGEN: Strep Pneumo Urinary Antigen: NEGATIVE

## 2014-08-02 LAB — LACTIC ACID, PLASMA: Lactic Acid, Venous: 1.7 mmol/L (ref 0.5–2.0)

## 2014-08-02 LAB — PROCALCITONIN: PROCALCITONIN: 32.53 ng/mL

## 2014-08-02 LAB — D-DIMER, QUANTITATIVE (NOT AT ARMC): D DIMER QUANT: 4.77 ug{FEU}/mL — AB (ref 0.00–0.48)

## 2014-08-02 LAB — GLUCOSE, CAPILLARY: GLUCOSE-CAPILLARY: 94 mg/dL (ref 70–99)

## 2014-08-02 LAB — MRSA PCR SCREENING: MRSA by PCR: POSITIVE — AB

## 2014-08-02 LAB — MAGNESIUM: Magnesium: 1.8 mg/dL (ref 1.5–2.5)

## 2014-08-02 MED ORDER — ACETAMINOPHEN 325 MG PO TABS
650.0000 mg | ORAL_TABLET | ORAL | Status: DC | PRN
  Administered 2014-08-03 – 2014-08-10 (×5): 650 mg
  Filled 2014-08-02 (×5): qty 2

## 2014-08-02 MED ORDER — PIPERACILLIN-TAZOBACTAM 3.375 G IVPB
3.3750 g | Freq: Three times a day (TID) | INTRAVENOUS | Status: DC
  Administered 2014-08-03 – 2014-08-07 (×14): 3.375 g via INTRAVENOUS
  Filled 2014-08-02 (×17): qty 50

## 2014-08-02 MED ORDER — ALBUTEROL SULFATE (2.5 MG/3ML) 0.083% IN NEBU: 2.5000 mg | INHALATION_SOLUTION | RESPIRATORY_TRACT | Status: DC | PRN

## 2014-08-02 MED ORDER — FAMOTIDINE 20 MG PO TABS
20.0000 mg | ORAL_TABLET | Freq: Two times a day (BID) | ORAL | Status: DC
  Administered 2014-08-02 – 2014-08-09 (×15): 20 mg
  Filled 2014-08-02 (×17): qty 1

## 2014-08-02 MED ORDER — SODIUM CHLORIDE 0.9 % IV BOLUS (SEPSIS)
1000.0000 mL | Freq: Once | INTRAVENOUS | Status: AC
  Administered 2014-08-02: 1000 mL via INTRAVENOUS

## 2014-08-02 MED ORDER — CHLORHEXIDINE GLUCONATE CLOTH 2 % EX PADS
6.0000 | MEDICATED_PAD | Freq: Every day | CUTANEOUS | Status: AC
  Administered 2014-08-03 – 2014-08-07 (×5): 6 via TOPICAL

## 2014-08-02 MED ORDER — MAGNESIUM SULFATE 2 GM/50ML IV SOLN
2.0000 g | Freq: Once | INTRAVENOUS | Status: AC
  Administered 2014-08-02: 2 g via INTRAVENOUS
  Filled 2014-08-02: qty 50

## 2014-08-02 MED ORDER — MUPIROCIN 2 % EX OINT
1.0000 "application " | TOPICAL_OINTMENT | Freq: Two times a day (BID) | CUTANEOUS | Status: AC
  Administered 2014-08-02 – 2014-08-07 (×10): 1 via NASAL
  Filled 2014-08-02 (×2): qty 22

## 2014-08-02 MED ORDER — ENOXAPARIN SODIUM 40 MG/0.4ML ~~LOC~~ SOLN
40.0000 mg | SUBCUTANEOUS | Status: DC
  Administered 2014-08-02 – 2014-08-09 (×8): 40 mg via SUBCUTANEOUS
  Filled 2014-08-02 (×9): qty 0.4

## 2014-08-02 MED ORDER — METHADONE HCL 10 MG PO TABS
10.0000 mg | ORAL_TABLET | Freq: Every day | ORAL | Status: DC
  Administered 2014-08-03 – 2014-08-06 (×4): 10 mg
  Filled 2014-08-02 (×4): qty 1

## 2014-08-02 MED ORDER — ASPIRIN 300 MG RE SUPP: 300.0000 mg | RECTAL | Status: AC

## 2014-08-02 MED ORDER — SODIUM CHLORIDE 0.9 % IV SOLN
1250.0000 mg | Freq: Once | INTRAVENOUS | Status: AC
  Administered 2014-08-02: 1250 mg via INTRAVENOUS
  Filled 2014-08-02: qty 1250

## 2014-08-02 MED ORDER — SODIUM CHLORIDE 0.9 % IV SOLN
25.0000 ug/h | INTRAVENOUS | Status: DC
  Administered 2014-08-02: 150 ug/h via INTRAVENOUS
  Administered 2014-08-03: 300 ug/h via INTRAVENOUS
  Administered 2014-08-04 – 2014-08-05 (×2): 200 ug/h via INTRAVENOUS
  Filled 2014-08-02 (×5): qty 50

## 2014-08-02 MED ORDER — PIPERACILLIN-TAZOBACTAM 3.375 G IVPB 30 MIN
3.3750 g | Freq: Once | INTRAVENOUS | Status: AC
  Administered 2014-08-02: 3.375 g via INTRAVENOUS
  Filled 2014-08-02 (×2): qty 50

## 2014-08-02 MED ORDER — VANCOMYCIN HCL IN DEXTROSE 750-5 MG/150ML-% IV SOLN
750.0000 mg | Freq: Three times a day (TID) | INTRAVENOUS | Status: DC
  Administered 2014-08-03 – 2014-08-04 (×4): 750 mg via INTRAVENOUS
  Filled 2014-08-02 (×6): qty 150

## 2014-08-02 MED ORDER — ASPIRIN 81 MG PO CHEW
324.0000 mg | CHEWABLE_TABLET | ORAL | Status: AC
  Administered 2014-08-02: 324 mg via ORAL
  Filled 2014-08-02: qty 4

## 2014-08-02 MED ORDER — FENTANYL CITRATE 0.05 MG/ML IJ SOLN
100.0000 ug | INTRAMUSCULAR | Status: DC | PRN
  Filled 2014-08-02: qty 2

## 2014-08-02 MED ORDER — SODIUM CHLORIDE 0.9 % IV BOLUS (SEPSIS)
500.0000 mL | Freq: Once | INTRAVENOUS | Status: AC
  Administered 2014-08-02: 500 mL via INTRAVENOUS

## 2014-08-02 MED ORDER — SODIUM CHLORIDE 0.9 % IV SOLN
INTRAVENOUS | Status: DC
  Administered 2014-08-02 – 2014-08-04 (×2): via INTRAVENOUS

## 2014-08-02 MED ORDER — SODIUM CHLORIDE 0.9 % IV SOLN
250.0000 mL | INTRAVENOUS | Status: DC | PRN
  Administered 2014-08-06: 10 mL via INTRAVENOUS

## 2014-08-02 MED ORDER — POTASSIUM CHLORIDE 10 MEQ/50ML IV SOLN
10.0000 meq | INTRAVENOUS | Status: AC
  Administered 2014-08-02 – 2014-08-03 (×4): 10 meq via INTRAVENOUS
  Filled 2014-08-02 (×4): qty 50

## 2014-08-02 NOTE — Progress Notes (Signed)
ANTIBIOTIC CONSULT NOTE - INITIAL  Pharmacy Consult for vancomycin/zosyn Indication: sepsis  No Known Allergies  Patient Measurements: -Ht: 5' 8'' -Wt= 63.6  Vital Signs: Temp: 99.9 F (37.7 C) (01/27 1937) Temp Source: Oral (01/27 1937) Intake/Output from previous day:   Intake/Output from this shift:    Labs: No results for input(s): WBC, HGB, PLT, LABCREA, CREATININE in the last 72 hours. CrCl cannot be calculated (Unknown ideal weight.). No results for input(s): VANCOTROUGH, VANCOPEAK, VANCORANDOM, GENTTROUGH, GENTPEAK, GENTRANDOM, TOBRATROUGH, TOBRAPEAK, TOBRARND, AMIKACINPEAK, AMIKACINTROU, AMIKACIN in the last 72 hours.   Microbiology: Recent Results (from the past 720 hour(s))  Clostridium Difficile by PCR     Status: None   Collection Time: 07/06/14  4:26 AM  Result Value Ref Range Status   C difficile by pcr NEGATIVE NEGATIVE Final  Clostridium Difficile by PCR     Status: None   Collection Time: 07/18/14  6:32 AM  Result Value Ref Range Status   C difficile by pcr NEGATIVE NEGATIVE Final    Medical History: Past Medical History  Diagnosis Date  . Alcohol abuse   . Drug abuse     heroin  . Cirrhosis   . Portal hypertension     Medications:  Scheduled:  . methadone  10 mg Per Tube Daily    Assessment: 49 yo male [facility] (and was admitted there from SNF) with respiratory distress and to begin vancomycin and Zosyn for possible sepsis/HCAP. Labs are pending. SCr from [facility] on 08-Dec-2014 was 0.7 and estimated CrCl > 100.   1/27 zosyn 1/27 vancomycin  1/27 resp 1/27 urine 1/27 blood x2   Goal of Therapy:  Vancomycin trough level 15-20 mcg/ml  Plan:  -Vancomycin 1250mg  IV x1 followed by 750mg  IV q8h -Zosyn 3.375gm IV q8h -Will follow renal function, cultures and clinical progress  Harland Germanndrew Eyob Godlewski, Pharm D 2014/12/18 8:19 PM

## 2014-08-02 NOTE — H&P (Signed)
PULMONARY / CRITICAL CARE MEDICINE   Name: Paul Scott MRN: [id number] DOB: 04/04/1966    ADMISSION DATE:  Jan 12, 2015 CONSULTATION DATE:  03/08/15   REFERRING MD :  Duke Salviaandolph   CHIEF COMPLAINT:  Respiratory distress   INITIAL PRESENTATION:  49 y.o pmh polysubstance, cirrhosis, portal HTN, stroke, chronic PEG with complicated hospital course recently (see below).  [facility] with severe respiratory distress with hypoxia (77% on room air and 89% on NRB), accessory muscle use, rales,  fever (104), tachycardic to 102.  BP 110/54.  Failed NRB needed intubation [facility].   STUDIES:  1/27 CXR>>  SIGNIFICANT EVENTS: 11/23>> Admit to [facility] with AMS, intubated, Also PEA arrest  12/02>> [facility], on phenobarbitol  12/10>> respiratory and bradycardic arrest, intubated. CT head negative  12/14>>planned extubation @12  n. Failed with reintubation 12/15  12/16>> trached DF>>1/13 PCCM re contacted for decannulation  12/17>> liberated from vent  12/14>> PEG-IR 1/27>>[facility] for respiratory distress, fever, tachycardia   HISTORY OF PRESENT ILLNESS:   49 y.o pmh polysubstance, cirrhosis, portal HTN, stroke, chronic PEG with complicated hospital course recently (see below).  [facility] with severe respiratory distress with hypoxia (77% on room air and 89% on NRB), accessory muscle use, rales,  fever (104), tachycardic to 102.  BP 110/54.  Failed NRB needed intubation [facility].   PAST MEDICAL HISTORY :   has a past medical history of Alcohol abuse; Drug abuse; Cirrhosis; and Portal hypertension.  has no past surgical history on file. Prior to Admission medications   Not on File   No Known Allergies  FAMILY HISTORY:  has no family status information on file.  SOCIAL HISTORY:     REVIEW OF SYSTEMS:   Unable to obtain with ETT   SUBJECTIVE:  Unable to obtain with ETT   VITAL SIGNS: FiO2 (%):  [70 %] 70 % (01/27 1900) HEMODYNAMICS:   VENTILATOR SETTINGS: Vent Mode:  [-] PRVC FiO2 (%):  [70 %] 70 % Set Rate:  [24 bmp] 24 bmp Vt Set:  [450 mL] 450 mL PEEP:  [5 cmH20] 5 cmH20 INTAKE / OUTPUT: No intake or output data in the 24 hours ending 009/01/16 1931  PHYSICAL EXAMINATION: General:  Lying in bed, nad  Neuro:  Opens eyes intermittently, physical deconditioning  HEENT:  [place]/at Cardiovascular:  RRR, no murmurs  Lungs:  ctab Abdomen:  Soft, PEG tube, sl distended, nt, wnl bowel sounds Musculoskeletal:  Intact  Skin:  Intact. Peg site clean   LABS:  CBC No results for input(s): WBC, HGB, HCT, PLT in the last 168 hours. Coag's No results for input(s): APTT, INR in the last 168 hours. BMET No results for input(s): NA, K, CL, CO2, BUN, CREATININE, GLUCOSE in the last 168 hours. Electrolytes No results for input(s): CALCIUM, MG, PHOS in the last 168 hours. Sepsis Markers No results for input(s): LATICACIDVEN, PROCALCITON, O2SATVEN in the last 168 hours. ABG No results for input(s): PHART, PCO2ART, PO2ART in the last 168 hours. Liver Enzymes No results for input(s): AST, ALT, ALKPHOS, BILITOT, ALBUMIN in the last 168 hours. Cardiac Enzymes No results for input(s): TROPONINI, PROBNP in the last 168 hours. Glucose No results for input(s): GLUCAP in the last 168 hours.  Imaging No results found.   ASSESSMENT / PLAN: PULMONARY H/o trach  12/16>>1/13 OETT 1/27>> A: Acute hypoxic Respiratory failure could be due to HCAP, concern for PE as well (low to moderate [place] risk), h/o aspiration pna and VAP  P:   On abx (see below)  Alb prn, CXR, abg now and in am  Continue vent support +/- SBT if and when ready  Will check D dimer if negative r/o PE if elevated consider dopplers and w/u for PE with CTA chest   CARDIOVASCULAR CVL Left IJ 1/27>> A:   Elevated troponin (0.11 ) and new ST depression  H/o HTN now hypotensive  CHF, chronic (no echo on file so unspecified) H/o cardiopulmonary arrest 05/2014  P:  Trend troponin, consult cardiology, EKG now  Hold Clonidine 0.3 patch weekly, hold Toprol XL 25 qd  CVP monitoring Monitor VS. MAP >65 or sbp>90 Already given 3 L NS , will give additional 500 cc Low threshold to start Levo if needed   RENAL A:   Lactic acidosis  P:   Trend lactic acid  Trend electrolytes and replete prn   GASTROINTESTINAL A:   H/o Cirrhosis, portal HTN, ascites likely 2/2 alcohol abuse  H/o C diff 06/01/2014  Transaminitis  NPO with G tube (placed 06/29/14 by IR) P:   Will trend CMET  NPO for now consider TF in am   HEMATOLOGIC A:   DVT px  P:  Lovenox  Trend CBC  INFECTIOUS A:   Sepsis with source possibly HCAP, possibly UTI (recently tx'ed), will check for C. Diff with history  Leukocytosis with bandemia  Fever  P:   BCx2 1/27>> UC 1/27>> Sputum 1/27>> Flu 1/27>> C.diff 1/27>> MRSA 1/27>> Abx: Vancomycin 1/27, Zosyn 1/27   Trend CBC, PCT, lactic acid on Abx to continue  Pancultures, CXR, check for C. Diff and influenza   ENDOCRINE A:   No acute issues  P:   none  NEUROLOGIC A:   Acute encephalopathy  H/o Alcohol abuse with h/o DTs, h/o heroin abuse  H/o Severe anoxic brain injury  H/o stroke   P:   RASS goal: -1 Will continue Fentanyl gtt and prn  Restarted Methadone 10 mg bid  If needed restart home Haldol 2 mg bid per peg    FAMILY  - Updates: updated father 1/27 - Inter-disciplinary family meet or Palliative Care meeting due by:08/09/14     Pulmonary and Critical Care Medicine [facility] Pager: [phone]  07/29/2014, 7:31 PM  Desma Maxim MD

## 2014-08-03 ENCOUNTER — Inpatient Hospital Stay (HOSPITAL_COMMUNITY): Payer: Medicare Other

## 2014-08-03 ENCOUNTER — Encounter (HOSPITAL_COMMUNITY): Payer: Self-pay

## 2014-08-03 DIAGNOSIS — G9340 Encephalopathy, unspecified: Secondary | ICD-10-CM | POA: Diagnosis present

## 2014-08-03 DIAGNOSIS — R9431 Abnormal electrocardiogram [ECG] [EKG]: Secondary | ICD-10-CM | POA: Diagnosis present

## 2014-08-03 DIAGNOSIS — R7989 Other specified abnormal findings of blood chemistry: Secondary | ICD-10-CM

## 2014-08-03 DIAGNOSIS — R945 Abnormal results of liver function studies: Secondary | ICD-10-CM

## 2014-08-03 DIAGNOSIS — J690 Pneumonitis due to inhalation of food and vomit: Secondary | ICD-10-CM

## 2014-08-03 DIAGNOSIS — R778 Other specified abnormalities of plasma proteins: Secondary | ICD-10-CM | POA: Diagnosis present

## 2014-08-03 DIAGNOSIS — R6521 Severe sepsis with septic shock: Secondary | ICD-10-CM

## 2014-08-03 DIAGNOSIS — A419 Sepsis, unspecified organism: Secondary | ICD-10-CM | POA: Diagnosis present

## 2014-08-03 DIAGNOSIS — R791 Abnormal coagulation profile: Secondary | ICD-10-CM

## 2014-08-03 LAB — POCT I-STAT 3, ART BLOOD GAS (G3+)
ACID-BASE DEFICIT: 5 mmol/L — AB (ref 0.0–2.0)
Acid-base deficit: 7 mmol/L — ABNORMAL HIGH (ref 0.0–2.0)
BICARBONATE: 20.3 meq/L (ref 20.0–24.0)
Bicarbonate: 19.1 mEq/L — ABNORMAL LOW (ref 20.0–24.0)
O2 Saturation: 98 %
O2 Saturation: 92 %
PH ART: 7.29 — AB (ref 7.350–7.450)
PH ART: 7.306 — AB (ref 7.350–7.450)
Patient temperature: 101.8
TCO2: 20 mmol/L (ref 0–100)
TCO2: 21 mmol/L (ref 0–100)
pCO2 arterial: 39.8 mmHg (ref 35.0–45.0)
pCO2 arterial: 41.4 mmHg (ref 35.0–45.0)
pO2, Arterial: 116 mmHg — ABNORMAL HIGH (ref 80.0–100.0)
pO2, Arterial: 77 mmHg — ABNORMAL LOW (ref 80.0–100.0)

## 2014-08-03 LAB — CBC WITH DIFFERENTIAL/PLATELET
Basophils Absolute: 0 10*3/uL (ref 0.0–0.1)
Basophils Absolute: 0 10*3/uL (ref 0.0–0.1)
Basophils Relative: 0 % (ref 0–1)
Basophils Relative: 0 % (ref 0–1)
EOS ABS: 0.1 10*3/uL (ref 0.0–0.7)
Eosinophils Absolute: 0.2 10*3/uL (ref 0.0–0.7)
Eosinophils Relative: 2 % (ref 0–5)
Eosinophils Relative: 5 % (ref 0–5)
HEMATOCRIT: 26.8 % — AB (ref 39.0–52.0)
HEMATOCRIT: 28.1 % — AB (ref 39.0–52.0)
HEMOGLOBIN: 8.7 g/dL — AB (ref 13.0–17.0)
Hemoglobin: 9 g/dL — ABNORMAL LOW (ref 13.0–17.0)
LYMPHS PCT: 5 % — AB (ref 12–46)
Lymphocytes Relative: 5 % — ABNORMAL LOW (ref 12–46)
Lymphs Abs: 0.2 10*3/uL — ABNORMAL LOW (ref 0.7–4.0)
Lymphs Abs: 0.3 10*3/uL — ABNORMAL LOW (ref 0.7–4.0)
MCH: 28.9 pg (ref 26.0–34.0)
MCH: 29.7 pg (ref 26.0–34.0)
MCHC: 32.5 g/dL (ref 30.0–36.0)
MCHC: 32 g/dL (ref 30.0–36.0)
MCV: 90.4 fL (ref 78.0–100.0)
MCV: 91.5 fL (ref 78.0–100.0)
MONO ABS: 0.2 10*3/uL (ref 0.1–1.0)
Monocytes Absolute: 0.4 10*3/uL (ref 0.1–1.0)
Monocytes Relative: 7 % (ref 3–12)
Monocytes Relative: 7 % (ref 3–12)
NEUTROS ABS: 3 10*3/uL (ref 1.7–7.7)
NEUTROS PCT: 83 % — AB (ref 43–77)
Neutro Abs: 4.5 10*3/uL (ref 1.7–7.7)
Neutrophils Relative %: 86 % — ABNORMAL HIGH (ref 43–77)
PLATELETS: 79 10*3/uL — AB (ref 150–400)
Platelets: 89 10*3/uL — ABNORMAL LOW (ref 150–400)
RBC: 2.93 MIL/uL — ABNORMAL LOW (ref 4.22–5.81)
RBC: 3.11 MIL/uL — AB (ref 4.22–5.81)
RDW: 13.2 % (ref 11.5–15.5)
RDW: 13.3 % (ref 11.5–15.5)
WBC: 3.5 10*3/uL — ABNORMAL LOW (ref 4.0–10.5)
WBC: 5.4 10*3/uL (ref 4.0–10.5)

## 2014-08-03 LAB — INFLUENZA PANEL BY PCR (TYPE A & B)
H1N1 flu by pcr: NOT DETECTED
INFLBPCR: NEGATIVE
Influenza A By PCR: NEGATIVE

## 2014-08-03 LAB — BASIC METABOLIC PANEL
Anion gap: 3 — ABNORMAL LOW (ref 5–15)
BUN: 11 mg/dL (ref 6–23)
CO2: 23 mmol/L (ref 19–32)
Calcium: 8.1 mg/dL — ABNORMAL LOW (ref 8.4–10.5)
Chloride: 111 mmol/L (ref 96–112)
Creatinine, Ser: 0.62 mg/dL (ref 0.50–1.35)
Glucose, Bld: 107 mg/dL — ABNORMAL HIGH (ref 70–99)
POTASSIUM: 3.9 mmol/L (ref 3.5–5.1)
Sodium: 137 mmol/L (ref 135–145)

## 2014-08-03 LAB — GLUCOSE, CAPILLARY
Glucose-Capillary: 94 mg/dL (ref 70–99)
Glucose-Capillary: 86 mg/dL (ref 70–99)
Glucose-Capillary: 75 mg/dL (ref 70–99)

## 2014-08-03 LAB — PROCALCITONIN: Procalcitonin: 26.1 ng/mL

## 2014-08-03 LAB — LEGIONELLA ANTIGEN, URINE

## 2014-08-03 LAB — TRIGLYCERIDES: TRIGLYCERIDES: 97 mg/dL (ref <150)

## 2014-08-03 LAB — TROPONIN I
Troponin I: 0.06 ng/mL — ABNORMAL HIGH (ref <0.031)
Troponin I: 0.05 ng/mL — ABNORMAL HIGH (ref <0.031)

## 2014-08-03 MED ORDER — CLONIDINE HCL 0.3 MG/24HR TD PTWK: 0.3000 mg | MEDICATED_PATCH | TRANSDERMAL | Status: DC

## 2014-08-03 MED ORDER — METOPROLOL TARTRATE 1 MG/ML IV SOLN
2.5000 mg | Freq: Once | INTRAVENOUS | Status: AC
  Administered 2014-08-03: 2.5 mg via INTRAVENOUS

## 2014-08-03 MED ORDER — DIPHENHYDRAMINE HCL 50 MG/ML IJ SOLN: 25.0000 mg | Freq: Four times a day (QID) | INTRAMUSCULAR | Status: DC | PRN

## 2014-08-03 MED ORDER — SODIUM CHLORIDE 0.9 % IV BOLUS (SEPSIS)
1000.0000 mL | Freq: Once | INTRAVENOUS | Status: AC
  Administered 2014-08-03: 1000 mL via INTRAVENOUS

## 2014-08-03 MED ORDER — PHENYLEPHRINE HCL 10 MG/ML IJ SOLN
0.0000 ug/min | INTRAVENOUS | Status: DC
  Administered 2014-08-03: 20 ug/min via INTRAVENOUS
  Administered 2014-08-05: 100 ug/min via INTRAVENOUS
  Administered 2014-08-06: 200 ug/min via INTRAVENOUS
  Administered 2014-08-06: 90 ug/min via INTRAVENOUS
  Administered 2014-08-06: 106.667 ug/min via INTRAVENOUS
  Administered 2014-08-06: 220 ug/min via INTRAVENOUS
  Administered 2014-08-07: 140 ug/min via INTRAVENOUS
  Administered 2014-08-07 (×2): 106.7 ug/min via INTRAVENOUS
  Administered 2014-08-07: 140 ug/min via INTRAVENOUS
  Administered 2014-08-07: 130 ug/min via INTRAVENOUS
  Administered 2014-08-08: 110 ug/min via INTRAVENOUS
  Administered 2014-08-08 (×2): 130 ug/min via INTRAVENOUS
  Administered 2014-08-09: 70 ug/min via INTRAVENOUS
  Filled 2014-08-03 (×18): qty 4

## 2014-08-03 MED ORDER — METOPROLOL TARTRATE 1 MG/ML IV SOLN
INTRAVENOUS | Status: AC
  Administered 2014-08-03: 2.5 mg via INTRAVENOUS
  Filled 2014-08-03: qty 5

## 2014-08-03 MED ORDER — PRO-STAT SUGAR FREE PO LIQD
30.0000 mL | Freq: Two times a day (BID) | ORAL | Status: DC
  Administered 2014-08-03: 30 mL
  Filled 2014-08-03 (×2): qty 30

## 2014-08-03 MED ORDER — CETYLPYRIDINIUM CHLORIDE 0.05 % MT LIQD
7.0000 mL | Freq: Four times a day (QID) | OROMUCOSAL | Status: DC
  Administered 2014-08-03 – 2014-08-10 (×29): 7 mL via OROMUCOSAL

## 2014-08-03 MED ORDER — DIPHENHYDRAMINE HCL 50 MG/ML IJ SOLN
INTRAMUSCULAR | Status: AC
  Filled 2014-08-03: qty 1

## 2014-08-03 MED ORDER — VITAL HIGH PROTEIN PO LIQD
1000.0000 mL | ORAL | Status: DC
  Filled 2014-08-03 (×2): qty 1000

## 2014-08-03 MED ORDER — CHLORHEXIDINE GLUCONATE 0.12 % MT SOLN
15.0000 mL | Freq: Two times a day (BID) | OROMUCOSAL | Status: DC
  Administered 2014-08-03 – 2014-08-09 (×14): 15 mL via OROMUCOSAL
  Filled 2014-08-03 (×13): qty 15

## 2014-08-03 MED ORDER — NOREPINEPHRINE BITARTRATE 1 MG/ML IV SOLN
2.0000 ug/min | INTRAVENOUS | Status: DC
  Administered 2014-08-03 (×2): 2 ug/min via INTRAVENOUS
  Filled 2014-08-03 (×2): qty 4

## 2014-08-03 MED ORDER — PROPOFOL 10 MG/ML IV EMUL
0.0000 ug/kg/min | INTRAVENOUS | Status: DC
  Administered 2014-08-03: 50 ug/kg/min via INTRAVENOUS
  Administered 2014-08-03: 15 ug/kg/min via INTRAVENOUS
  Administered 2014-08-04: 20 ug/kg/min via INTRAVENOUS
  Administered 2014-08-04 (×2): 35 ug/kg/min via INTRAVENOUS
  Administered 2014-08-04: 40 ug/kg/min via INTRAVENOUS
  Administered 2014-08-05: 25 ug/kg/min via INTRAVENOUS
  Administered 2014-08-05: 10 ug/kg/min via INTRAVENOUS
  Administered 2014-08-06 (×2): 30 ug/kg/min via INTRAVENOUS
  Administered 2014-08-07: 15 ug/kg/min via INTRAVENOUS
  Administered 2014-08-07 (×2): 30 ug/kg/min via INTRAVENOUS
  Administered 2014-08-08 (×3): 50 ug/kg/min via INTRAVENOUS
  Administered 2014-08-08: 30 ug/kg/min via INTRAVENOUS
  Administered 2014-08-09 – 2014-08-10 (×7): 50 ug/kg/min via INTRAVENOUS
  Filled 2014-08-03 (×3): qty 100
  Filled 2014-08-03: qty 200
  Filled 2014-08-03 (×12): qty 100
  Filled 2014-08-03 (×2): qty 200
  Filled 2014-08-03 (×4): qty 100

## 2014-08-03 MED ORDER — FENTANYL CITRATE 0.05 MG/ML IJ SOLN: 100.0000 ug | INTRAMUSCULAR | Status: DC | PRN

## 2014-08-03 MED ORDER — METOPROLOL TARTRATE 12.5 MG HALF TABLET
12.5000 mg | ORAL_TABLET | Freq: Two times a day (BID) | ORAL | Status: DC
  Administered 2014-08-03 – 2014-08-07 (×7): 12.5 mg
  Filled 2014-08-03 (×11): qty 1

## 2014-08-03 MED ORDER — VITAL AF 1.2 CAL PO LIQD
1000.0000 mL | ORAL | Status: DC
  Administered 2014-08-03 – 2014-08-05 (×3): 1000 mL
  Filled 2014-08-03 (×14): qty 1000

## 2014-08-03 MED ORDER — PROPOFOL 10 MG/ML IV BOLUS
0.5000 mg/kg | Freq: Once | INTRAVENOUS | Status: DC
Start: 2014-08-03 — End: 2014-08-03

## 2014-08-03 MED ORDER — IOHEXOL 350 MG/ML SOLN
100.0000 mL | Freq: Once | INTRAVENOUS | Status: AC | PRN
  Administered 2014-08-03: 100 mL via INTRAVENOUS

## 2014-08-03 MED ORDER — DIPHENHYDRAMINE HCL 50 MG/ML IJ SOLN
50.0000 mg | Freq: Once | INTRAMUSCULAR | Status: AC
  Administered 2014-08-03: 50 mg via INTRAVENOUS

## 2014-08-03 MED ORDER — CLONAZEPAM 0.5 MG PO TABS
1.0000 mg | ORAL_TABLET | Freq: Three times a day (TID) | ORAL | Status: DC
  Administered 2014-08-03 – 2014-08-06 (×9): 1 mg
  Filled 2014-08-03 (×9): qty 2

## 2014-08-03 MED ORDER — PROPOFOL BOLUS VIA INFUSION: 0.5000 mg/kg | Freq: Once | INTRAVENOUS | Status: DC

## 2014-08-03 MED ORDER — HALOPERIDOL 2 MG PO TABS
2.0000 mg | ORAL_TABLET | Freq: Two times a day (BID) | ORAL | Status: DC
  Administered 2014-08-03 – 2014-08-04 (×4): 2 mg
  Filled 2014-08-03 (×6): qty 1

## 2014-08-03 MED ORDER — PRO-STAT SUGAR FREE PO LIQD
30.0000 mL | Freq: Two times a day (BID) | ORAL | Status: AC
  Administered 2014-08-03: 30 mL
  Filled 2014-08-03 (×2): qty 30

## 2014-08-03 MED ORDER — METOPROLOL TARTRATE 1 MG/ML IV SOLN
2.5000 mg | Freq: Four times a day (QID) | INTRAVENOUS | Status: DC | PRN
  Administered 2014-08-07: 5 mg via INTRAVENOUS
  Filled 2014-08-03: qty 5

## 2014-08-03 MED ORDER — IPRATROPIUM-ALBUTEROL 0.5-2.5 (3) MG/3ML IN SOLN
3.0000 mL | RESPIRATORY_TRACT | Status: DC
  Administered 2014-08-03 – 2014-08-05 (×10): 3 mL via RESPIRATORY_TRACT
  Filled 2014-08-03 (×10): qty 3

## 2014-08-03 NOTE — Progress Notes (Signed)
*  PRELIMINARY RESULTS* Vascular Ultrasound Lower extremity venous duplex has been completed.  Preliminary findings: No evidence of DVT in visualized veins.   Farrel DemarkJill Eunice, RDMS, RVT  08/03/2014, 11:27 AM

## 2014-08-03 NOTE — Progress Notes (Signed)
UR Completed.  [phone]  

## 2014-08-03 NOTE — Progress Notes (Addendum)
INITIAL NUTRITION ASSESSMENT  DOCUMENTATION CODES Per approved criteria  -Not Applicable   INTERVENTION: When medically able, Initiate TF via PEG with Vital 1.2 at 25 ml/h and Prostat 30 ml BID on day 1; on day 2 d/c prostat and  increase to goal rate of 70 ml/h (1680 ml per day) to provide 2016 kcals, 126 gm protein, 1362 ml free water daily.       NUTRITION DIAGNOSIS: Inadequate oral intake related to inability to eat as evidenced by NPO status  Goal: Pt to meet >/= 90% of their estimated nutrition needs    Monitor:  I/Os, TF to goal, Labs,   Reason for Assessment:  Vent/Consult for administration of TF   Admitting Dx: Septic shock  ASSESSMENT: 49 y.o pmh polysubstance, cirrhosis, portal HTN, stroke, chronic PEG  presents  with severe respiratory distress with hypoxia.  Patient intubated/sedated. Unable to determine oral intake/weight history prior to admission  Patient is currently intubated on ventilator support MV: 16.9 L/min Temp (24hrs), Avg:99 F (37.2 C), Min:97.4 F (36.3 C), Max:99.9 F (37.7 C)   Nutrition Focused Physical Exam:  Subcutaneous Fat:  Orbital Region: None Upper Arm Region: None Thoracic and Lumbar Region: N/A  Muscle:  Temple Region: Mild Clavicle Bone Region: None Clavicle and Acromion Bone Region: None Scapular Bone Region: None Dorsal Hand: N/A Patellar Region: None Anterior Thigh Region: N/A Posterior Calf Region: None  Edema: None   Height: Ht Readings from Last 1 Encounters:  No data found for Ht    Weight: Wt Readings from Last 1 Encounters:  08/03/14 146 lb 9.7 oz (66.5 kg)    Ideal Body Weight: 154 lbs  % Ideal Body Weight: 95%  Wt Readings from Last 10 Encounters:  08/03/14 146 lb 9.7 oz (66.5 kg)   Usual Body Weight: Unable to determine   BMI:  There is no height on file to calculate BMI. Height~ 5\' 8"  (172.7 cm) per pharmacy note. BMI ~22.3  Estimated Nutritional Needs: Kcal: 2050  Protein:  >100g Fluid: per MD  Skin: Dry  Diet Order: Diet NPO time specified  EDUCATION NEEDS: -Education not appropriate at this time   Intake/Output Summary (Last 24 hours) at 08/03/14 0934 Last data filed at 08/03/14 0900  Gross per 24 hour  Intake 3346.01 ml  Output    580 ml  Net 2766.01 ml     Last BM: N/A  Labs:   Recent Labs Lab 07/11/2014 2043 08/03/14 0740  NA 137 137  K 3.7 3.9  CL 105 111  CO2 27 23  BUN 15 11  CREATININE 0.72 0.62  CALCIUM 9.0 8.1*  MG 1.8  --   PHOS 3.6  --   GLUCOSE 102* 107*    CBG (last 3)   Recent Labs  07/12/2014 1917 08/03/14 0752  GLUCAP 94 94    Scheduled Meds: . antiseptic oral rinse  7 mL Mouth Rinse QID  . chlorhexidine  15 mL Mouth Rinse BID  . Chlorhexidine Gluconate Cloth  6 each Topical Q0600  . enoxaparin (LOVENOX) injection  40 mg Subcutaneous Q24H  . famotidine  20 mg Per Tube BID  . methadone  10 mg Per Tube Daily  . mupirocin ointment  1 application Nasal BID  . piperacillin-tazobactam (ZOSYN)  IV  3.375 g Intravenous Q8H  . vancomycin  750 mg Intravenous Q8H    Continuous Infusions: . sodium chloride 100 mL/hr at 08/05/2014 2158  . fentaNYL infusion INTRAVENOUS 300 mcg/hr (08/03/14 0825)  . norepinephrine (  LEVOPHED) Adult infusion 2 mcg/min (08/03/14 4098)    Past Medical History  Diagnosis Date  . Alcohol abuse   . Drug abuse     heroin  . Cirrhosis   . Portal hypertension     No past surgical history on file.  Christophe Louis RD, LDN Nutrition [id number] 08/03/2014 10:05 AM

## 2014-08-03 NOTE — Progress Notes (Signed)
PULMONARY / CRITICAL CARE MEDICINE   Name: Paul Scott MRN: [id number] DOB: 09/15/1965    ADMISSION DATE:  08/01/2014 CONSULTATION DATE:  08/03/2014   REFERRING MD :  Duke Salviaandolph   CHIEF COMPLAINT:  Respiratory distress   INITIAL PRESENTATION:  49 y.o pmh polysubstance, cirrhosis, portal HTN, stroke, chronic PEG with complicated hospital course recently (see below).  [facility] with severe respiratory distress with hypoxia (77% on room air and 89% on NRB), accessory muscle use, rales,  fever (104), tachycardic to 102.  BP 110/54.  Failed NRB needed [facility]ubation [facility].   STUDIES:  1/27 CXR>> negative 1/28 CT Angio Chest>> Neg for PE. Multifocal nodular areas of ground glass infiltration bilaterally. Multifocal PNA vs. Atypical PNA (fungal, TB, septic emboli).   SIGNIFICANT EVENTS: 11/23>> Admit to [facility] with AMS, intubated, Also PEA arrest  12/02>> [facility], on phenobarbitol  12/10>> respiratory and bradycardic arrest, intubated. CT head negative  12/14>>planned extubation @12  n. Failed with reintubation 12/15  12/16>> trached DF>>1/13 PCCM re contacted for decannulation  12/17>> liberated from vent  12/14>> PEG-IR 1/27>>[facility] for respiratory distress, fever, tachycardia  SUBJECTIVE: Sedated on vent. Responds to noxious stimuli.    VITAL SIGNS: Temp:  [97.4 F (36.3 C)-99.9 F (37.7 C)] 99.7 F (37.6 C) (01/28 0600) Pulse Rate:  [66-93] 70 (01/28 0600) Resp:  [14-27] 22 (01/28 0600) BP: (78-127)/(39-73) 102/58 mmHg (01/28 0600) SpO2:  [91 %-100 %] 100 % (01/28 0600) FiO2 (%):  [60 %-70 %] 60 % (01/28 0408) Weight:  [63.6 kg (140 lb 3.4 oz)-66.5 kg (146 lb 9.7 oz)] 66.5 kg (146 lb 9.7 oz) (01/28 0500) HEMODYNAMICS: CVP:  [3 mmHg-7 mmHg] 7 mmHg VENTILATOR SETTINGS: Vent Mode:  [-] PRVC FiO2 (%):  [60 %-70 %] 60 % Set Rate:  [24  bmp] 24 bmp Vt Set:  [450 mL] 450 mL PEEP:  [5 cmH20] 5 cmH20 Plateau Pressure:  [18 cmH20-20 cmH20] 20 cmH20 INTAKE / OUTPUT:  Intake/Output Summary (Last 24 hours) at 08/03/14 [id number] Last data filed at 08/03/14 0600  Gross per 24 hour  Intake 2918.51 ml  Output    285 ml  Net 2633.51 ml    PHYSICAL EXAMINATION: General:  Sedated on vent. NAD Neuro:  Responds to noxious stimuli, PERRL HEENT:  [place]/AT, PERRL Cardiovascular:  RRR, no m/g/r Lungs: CTA bilaterally Abdomen: BS+, soft, non-distended. PEG tube in place Musculoskeletal: no edema  Skin:  Warm, dry. Peg site clean   LABS:  CBC  Recent Labs Lab 08/03/14 0058  WBC 3.5*  HGB 8.7*  HCT 26.8*  PLT 79*   Coag's No results for input(s): APTT, INR in the last 168 hours. BMET  Recent Labs Lab 08/01/2014 2043  NA 137  K 3.7  CL 105  CO2 27  BUN 15  CREATININE 0.72  GLUCOSE 102*   Electrolytes  Recent Labs Lab 07/18/2014 2043  CALCIUM 9.0  MG 1.8  PHOS 3.6   Sepsis Markers  Recent Labs Lab 07/09/2014 2043 08/03/14 0139  LATICACIDVEN 1.7  --   PROCALCITON 32.53 26.10   ABG  Recent Labs Lab 07/23/2014 2017 08/03/14 0421  PHART 7.381 7.290*  PCO2ART 38.9 39.8  PO2ART 105.0* 116.0*   Liver Enzymes  Recent Labs Lab 08/01/2014 2043  AST 51*  ALT 32  ALKPHOS 114  BILITOT 1.2  ALBUMIN 2.9*   Cardiac Enzymes  Recent Labs Lab 08/19/14 2043 08/03/14 0139  TROPONINI 0.07* 0.06*   Glucose  Recent Labs Lab 08/19/2014 1917  GLUCAP 94    Imaging Dg Chest Port 1 View  08/19/2014   CLINICAL DATA:  Respiratory failure  EXAM: PORTABLE CHEST - 1 VIEW  COMPARISON:  08-19-14  FINDINGS: The endotracheal tube tip is above the carina. There is a left IJ catheter with tip in the projection of the SVC. Heart size is normal. There is no pleural effusion or airspace consolidation.  IMPRESSION: 1. Satisfactory position of support apparatus without complications. 2. Lungs remain clear.   Electronically  Signed   By: Signa Kell M.D.   On: Aug 19, 2014 20:00     ASSESSMENT / PLAN: PULMONARY H/o trach 12/16>>1/13 OETT 1/27>> A: Acute hypoxic Respiratory failure- ?HCAP, ?atypical PNA, ?aspiration PNA- ground glass appearance on CTA Chest, no PE h/o aspiration pna and VAP  P:   On abx (see below)  Albuterol prn Continue vent support +/- SBT if and when ready  D-dimer elevated- CTA Chest neg for PE, awaiting dopplers LE  CARDIOVASCULAR CVL Left IJ 1/27>> A:  Elevated troponin (0.11 , 0.07,0.06 here) and new ST depression > demand ischemia? H/o HTN now hypotensive  CHF, chronic (no echo on file so unspecified) H/o cardiopulmonary arrest 05/2014  P:   Hold Clonidine 0.3 patch weekly, hold Toprol XL 25 qd  CVP monitoring Monitor VS. MAP >65 or sbp>90   RENAL A:   Lactic acidosis- resolved. Lactic acid 1.1 Metabolic acidosis P:   Bmet in AM Trend electrolytes and replete prn   GASTROINTESTINAL A:   H/o Cirrhosis, portal HTN, ascites likely 2/2 alcohol abuse  H/o C diff 06/01/2014  Transaminitis - mild  NPO with G tube (placed 06/29/14 by IR) P:   Will trend CMET  NPO for now consider TF in am   HEMATOLOGIC A:   Anemia P:  Trend Hbg VTE ppx- Lovenox SQ   INFECTIOUS A:   Sepsis with source possibly HCAP, UTI (recently tx'ed and present now)- resolving  Leukocytosis with bandemia  Fever- afebrile for last 12 hrs Thrombocytopenia- plts 79 P:   BCx2 1/27>> UC 1/27>> Sputum 1/27>> Flu 1/27>> C.diff 1/27>> MRSA 1/27>> positive Strep pneumo>> negative Legionella>>  Vancomycin 1/27>> Zosyn 1/27>>  Trend CBC PCT 32>26 F/u cultures   ENDOCRINE A:   No acute issues  P:   none  NEUROLOGIC A:   Acute encephalopathy  H/o Alcohol abuse with h/o DTs, h/o heroin abuse  H/o Severe anoxic brain injury  H/o stroke   P:   RASS goal: -1 Continue Fentanyl gtt and prn  Continue Methadone 10 mg bid  Consider restarting home Haldol 2 mg bid per  peg if needed   FAMILY  - Updates: updated father 1/27 - Inter-disciplinary family meet or Palliative Care meeting due by: 08/09/14    Rich Number, MD Internal Medicine Resident, PGY-1  Attending:  I have seen and examined the patient with nurse practitioner/resident and agree with the note above.   On exam he is curled up in bed on vent, not following commands, lungs with rhonchi bilaterally  Impression/Plan: The CT scan findings are consistent with aspiration pneumonia and are not felt to be consistent with TB given the clinical scenario Acute respiratory failure due to HCAP vs Aspiration pneumonia> wean vent as able, continue current Abx regimen Acute encephalopathy> complicated by baseline anoxic brain injury and now worse due to HCAP but I'm not clear what his baseline  is here, came from SNF; per our last notes (07/19/14 in [facility]) he was not following commands;  Given his poor prognosis from his anoxic injury, this scenario (aspiration, ICU admission) will keep repeating itself until he dies.  We need to discuss goals of care with family.  My cc time 35 minutes  Heber [place], MD [place] [facility] Pager: [phone] Cell: [phone] If no response, call [phone]

## 2014-08-03 NOTE — Progress Notes (Signed)
Pt transported to and from CT with no complications. 

## 2014-08-03 NOTE — Progress Notes (Signed)
Pt had CT angio of chest; Findings suggested atypical pneumonia, fungal or TB infection, or septic emboli;  ELINK MD and Infection Prevention (IP) called; Instructed not to place pt on airborne precautions at this time. IP will continue to follow.

## 2014-08-04 DIAGNOSIS — G931 Anoxic brain damage, not elsewhere classified: Secondary | ICD-10-CM

## 2014-08-04 LAB — CBC WITH DIFFERENTIAL/PLATELET
BASOS ABS: 0 10*3/uL (ref 0.0–0.1)
Basophils Relative: 0 % (ref 0–1)
EOS ABS: 0.1 10*3/uL (ref 0.0–0.7)
EOS PCT: 2 % (ref 0–5)
HEMATOCRIT: 27.8 % — AB (ref 39.0–52.0)
HEMOGLOBIN: 8.8 g/dL — AB (ref 13.0–17.0)
LYMPHS ABS: 0.7 10*3/uL (ref 0.7–4.0)
Lymphocytes Relative: 12 % (ref 12–46)
MCH: 28.9 pg (ref 26.0–34.0)
MCHC: 31.7 g/dL (ref 30.0–36.0)
MCV: 91.4 fL (ref 78.0–100.0)
MONO ABS: 0.4 10*3/uL (ref 0.1–1.0)
Monocytes Relative: 6 % (ref 3–12)
Neutro Abs: 4.7 10*3/uL (ref 1.7–7.7)
Neutrophils Relative %: 79 % — ABNORMAL HIGH (ref 43–77)
PLATELETS: 100 10*3/uL — AB (ref 150–400)
RBC: 3.04 MIL/uL — ABNORMAL LOW (ref 4.22–5.81)
RDW: 13.5 % (ref 11.5–15.5)
WBC: 5.9 10*3/uL (ref 4.0–10.5)

## 2014-08-04 LAB — GLUCOSE, CAPILLARY
GLUCOSE-CAPILLARY: 107 mg/dL — AB (ref 70–99)
GLUCOSE-CAPILLARY: 119 mg/dL — AB (ref 70–99)
GLUCOSE-CAPILLARY: 91 mg/dL (ref 70–99)
GLUCOSE-CAPILLARY: 96 mg/dL (ref 70–99)
Glucose-Capillary: 114 mg/dL — ABNORMAL HIGH (ref 70–99)
Glucose-Capillary: 84 mg/dL (ref 70–99)

## 2014-08-04 LAB — PROCALCITONIN: PROCALCITONIN: 16.82 ng/mL

## 2014-08-04 LAB — BASIC METABOLIC PANEL
Anion gap: 2 — ABNORMAL LOW (ref 5–15)
BUN: 10 mg/dL (ref 6–23)
CO2: 27 mmol/L (ref 19–32)
CREATININE: 0.57 mg/dL (ref 0.50–1.35)
Calcium: 8.6 mg/dL (ref 8.4–10.5)
Chloride: 108 mmol/L (ref 96–112)
GFR calc Af Amer: >90 mL/min (ref >90)
GLUCOSE: 117 mg/dL — AB (ref 70–99)
POTASSIUM: 2.8 mmol/L — AB (ref 3.5–5.1)
Sodium: 137 mmol/L (ref 135–145)

## 2014-08-04 LAB — PHOSPHORUS: Phosphorus: 1.6 mg/dL — ABNORMAL LOW (ref 2.3–4.6)

## 2014-08-04 LAB — MAGNESIUM: MAGNESIUM: 1.7 mg/dL (ref 1.5–2.5)

## 2014-08-04 LAB — URINE CULTURE
Colony Count: >=100000
SPECIAL REQUESTS: NORMAL

## 2014-08-04 LAB — VANCOMYCIN, TROUGH: Vancomycin Tr: 25.7 ug/mL (ref 10.0–20.0)

## 2014-08-04 MED ORDER — IBUPROFEN 100 MG PO CHEW
200.0000 mg | CHEWABLE_TABLET | Freq: Once | ORAL | Status: DC
  Filled 2014-08-04: qty 2

## 2014-08-04 MED ORDER — POTASSIUM CHLORIDE 20 MEQ/15ML (10%) PO SOLN
40.0000 meq | Freq: Two times a day (BID) | ORAL | Status: AC
  Administered 2014-08-04 – 2014-08-05 (×4): 40 meq
  Filled 2014-08-04 (×5): qty 30

## 2014-08-04 MED ORDER — MAGNESIUM SULFATE IN D5W 10-5 MG/ML-% IV SOLN
1.0000 g | Freq: Once | INTRAVENOUS | Status: AC
  Administered 2014-08-04: 1 g via INTRAVENOUS
  Filled 2014-08-04: qty 100

## 2014-08-04 MED ORDER — POTASSIUM PHOSPHATES 15 MMOLE/5ML IV SOLN
20.0000 mmol | Freq: Once | INTRAVENOUS | Status: AC
  Administered 2014-08-04: 20 mmol via INTRAVENOUS
  Filled 2014-08-04: qty 6.67

## 2014-08-04 MED ORDER — IBUPROFEN 100 MG/5ML PO SUSP
400.0000 mg | Freq: Once | ORAL | Status: AC
  Administered 2014-08-04: 400 mg via ORAL
  Filled 2014-08-04: qty 20

## 2014-08-04 MED ORDER — VANCOMYCIN HCL IN DEXTROSE 750-5 MG/150ML-% IV SOLN
750.0000 mg | Freq: Two times a day (BID) | INTRAVENOUS | Status: DC
  Administered 2014-08-04 – 2014-08-09 (×11): 750 mg via INTRAVENOUS
  Filled 2014-08-04 (×13): qty 150

## 2014-08-04 NOTE — Progress Notes (Signed)
Panic vanc level called to pharmacy, Dr. Victorino SparrowMquaid also advised. Vanc Tr 25.7.

## 2014-08-04 NOTE — Progress Notes (Signed)
PULMONARY / CRITICAL CARE MEDICINE   Name: Paul Scott MRN: [id number] DOB: 28-Mar-1966    ADMISSION DATE:  08-20-14 CONSULTATION DATE:  August 20, 2014   REFERRING MD :  Duke Salvia   CHIEF COMPLAINT:  Respiratory distress   INITIAL PRESENTATION:  49 y.o pmh polysubstance, cirrhosis, portal HTN, stroke, chronic PEG with complicated hospital course recently (see below).  He presents from [facility] to [facility] with severe respiratory distress with hypoxia (77% on room air and 89% on NRB), accessory muscle use, rales,  fever (104), tachycardic to 102.  BP 110/54.  Failed NRB needed intubation transferred to [facility] for further care.   STUDIES:  1/27 CXR>> negative 1/28 CT Angio Chest>> Neg for PE. Multifocal nodular areas of ground glass infiltration bilaterally. Multifocal PNA vs. Atypical PNA (fungal, TB, septic emboli).  1/28 CXR>> Bilateral opacification, worsening at L mid lung  SIGNIFICANT EVENTS: 11/23>> Admit to [facility] with AMS, intubated, Also PEA arrest  12/02>> Tx to [facility] for rehab, on phenobarbitol  12/10>> respiratory and bradycardic arrest, intubated. CT head negative  12/14>>planned extubation @12  n. Failed with reintubation 12/15  12/16>> trached DF>>1/13 PCCM re contacted for decannulation  12/17>> liberated from vent  12/14>> PEG-IR 1/27>>sent to [facility] from [facility] for respiratory distress, fever, tachycardia  SUBJECTIVE: Yesterday, became very agitated and tachycardic to 160s. He was started on his home Klonopin and propofol gtt. Patient takes Metoprolol at home so half of his home dose was restarted, 12.5 mg BID. Patient also noted to have a rash on his face and arms, likely red man syndrome. Vanc was stopped and Benadryl given, then Vanc restarted slowly. He also had a fever of 102.5 overnight with green/brown respiratory secretions which was recultured. ABG concerning for ARDS with FiO2/PaCO2 ratio of ~150.   VITAL  SIGNS: Temp:  [99.1 F (37.3 C)-102.5 F (39.2 C)] 100.1 F (37.8 C) (01/29 0700) Pulse Rate:  [67-137] 79 (01/29 0700) Resp:  [8-36] 24 (01/29 0700) BP: (85-159)/(39-106) 101/53 mmHg (01/29 0700) SpO2:  [89 %-100 %] 100 % (01/29 0700) FiO2 (%):  [40 %-60 %] 50 % (01/29 0400) Weight:  [67.1 kg (147 lb 14.9 oz)] 67.1 kg (147 lb 14.9 oz) (01/29 0346) HEMODYNAMICS: CVP:  [8 mmHg-9 mmHg] 8 mmHg VENTILATOR SETTINGS: Vent Mode:  [-] PRVC FiO2 (%):  [40 %-60 %] 50 % Set Rate:  [24 bmp] 24 bmp Vt Set:  [450 mL] 450 mL PEEP:  [5 cmH20-8 cmH20] 8 cmH20 Plateau Pressure:  [13 cmH20-23 cmH20] 19 cmH20 INTAKE / OUTPUT:  Intake/Output Summary (Last 24 hours) at 08/04/14 0715 Last data filed at 08/04/14 0700  Gross per 24 hour  Intake 6553.92 ml  Output   2560 ml  Net 3993.92 ml    PHYSICAL EXAMINATION: General:  Sedated on vent. NAD Neuro:  Responds to noxious stimuli, PERRL HEENT:  [place]/AT, PERRL, ETT in place  Cardiovascular:  RRR, no m/g/r Lungs: rhonchi bilaterally Abdomen: BS+, soft, non-distended. PEG tube in place Musculoskeletal: no edema  Skin: Peg site clean. Upper extremities and face have patchy erythema, likely red man syndrome.   LABS:  CBC  Recent Labs Lab 08/03/14 0058 08/03/14 0740 08/04/14 0330  WBC 3.5* 5.4 5.9  HGB 8.7* 9.0* 8.8*  HCT 26.8* 28.1* 27.8*  PLT 79* 89* 100*   Coag's No results for input(s): APTT, INR in the last 168 hours. BMET  Recent Labs Lab 08-20-14 2043 08/03/14 0740 08/04/14 0330  NA 137 137 137  K 3.7 3.9 2.8*  CL 105 111 108  CO2 27 23 27   BUN 15 11 10   CREATININE 0.72 0.62 0.57  GLUCOSE 102* 107* 117*   Electrolytes  Recent Labs Lab 11/25/2014 2043 08/03/14 0740 08/04/14 0330  CALCIUM 9.0 8.1* 8.6  MG 1.8  --  1.7  PHOS 3.6  --  1.6*   Sepsis Markers  Recent Labs Lab 11/25/2014 2043 08/03/14 0139 08/04/14 0330  LATICACIDVEN 1.7  --   --   PROCALCITON 32.53 26.10 16.82   ABG  Recent Labs Lab  11/25/2014 2017 08/03/14 0421 08/03/14 2023  PHART 7.381 7.290* 7.306*  PCO2ART 38.9 39.8 41.4  PO2ART 105.0* 116.0* 77.0*   Liver Enzymes  Recent Labs Lab 11/25/2014 2043  AST 51*  ALT 32  ALKPHOS 114  BILITOT 1.2  ALBUMIN 2.9*   Cardiac Enzymes  Recent Labs Lab 11/25/2014 2043 08/03/14 0139 08/03/14 0740  TROPONINI 0.07* 0.06* 0.05*   Glucose  Recent Labs Lab 11/25/2014 1917 08/03/14 0752 08/03/14 1524 08/03/14 1905 08/04/14 0001 08/04/14 0406  GLUCAP 94 94 86 75 91 96    Imaging Ct Angio Chest Pe W/cm &/or Wo Cm  08/03/2014   CLINICAL DATA:  Hypoxia.  Elevated D-dimer.  EXAM: CT ANGIOGRAPHY CHEST WITH CONTRAST  TECHNIQUE: Multidetector CT imaging of the chest was performed using the standard protocol during bolus administration of intravenous contrast. Multiplanar CT image reconstructions and MIPs were obtained to evaluate the vascular anatomy.  CONTRAST:  100mL OMNIPAQUE IOHEXOL 350 MG/ML SOLN  COMPARISON:  None.  FINDINGS: Technically adequate study with good opacification of the central and segmental pulmonary arteries. No focal filling defects demonstrated. No evidence of significant pulmonary embolus.  Normal heart size. Normal caliber thoracic aorta. No evidence of aortic dissection. Coronary artery calcifications. Enteric and endotracheal tubes are present. Esophagus is decompressed. Enteric tube tip is in the lower esophagus. Advancement of the stomach is recommended for more optimal placement as clinically indicated. Mild prominence of right hilar and mediastinal lymph nodes. Largest lymph node in the right hilum measures 18 mm short axis dimension. This is nonspecific but likely reactive.  Multifocal patchy areas of nodular ground-glass infiltration demonstrated throughout both lungs. Some of the lesions have central cavitation or bronchiectasis. Appears is most likely represent inflammatory process. Consider atypical pneumonia versus septic emboli. TB or fungal  infection could have this appearance. Neoplasm is not entirely excluded and followup after resolution of acute process is recommended. There is additional chronic appearing areas of peripheral fibrosis on the left. No pleural effusion. No pneumothorax. Mild atelectasis in the lung bases.  Included portions of the upper abdominal organs demonstrate a small amount of fluid around the liver suggesting ascites. Degenerative changes in the thoracic spine. Mild superior endplate compression of an upper thoracic vertebrae. Deformity of the sternum suggest old fractures. Old right rib fractures.  Review of the MIP images confirms the above findings.  IMPRESSION: No evidence of significant pulmonary embolus. Multiple focal nodular areas of ground-glass infiltration throughout both lungs, some with cavitation. Findings suggest multifocal pneumonia, atypical pneumonia such as fungal or TB infection or septic emboli. Neoplasm is less likely but followup after resolution of acute process is recommended to exclude neoplastic involvement.   Electronically Signed   By: Burman NievesWilliam  Stevens M.D.   On: 08/03/2014 02:56   Dg Chest Port 1 View  08/03/2014   CLINICAL DATA:  Acute onset of shortness of breath. Initial encounter.  EXAM: PORTABLE CHEST - 1 VIEW  COMPARISON:  Chest radiograph from 07/23/2014, and CTA of the chest performed earlier today at 2:37 a.m.  FINDINGS: The patient's endotracheal tube is seen ending 2 cm above the carina. A left IJ line is noted ending about the mid SVC.  There is worsening left mid lung zone airspace opacification, concerning for worsening pneumonia. Underlying right-sided airspace opacities are better characterized on prior studies. As noted on recent CTA, this may be an atypical infection. No pleural effusion or pneumothorax is seen.  The cardiomediastinal silhouette is normal in size. No acute osseous abnormalities are identified.  IMPRESSION: Bilateral airspace opacification is worsening at the  left mid lung zone, concerning for worsening pneumonia. As noted on recent CTA, this may reflect an atypical infection.   Electronically Signed   By: Roanna Raider M.D.   On: 08/03/2014 20:31     ASSESSMENT / PLAN: PULMONARY H/o trach 12/16>>1/13 OETT 1/27>> A: Acute hypoxic Respiratory failure- ?HCAP, ?aspiration PNA- ground glass appearance on CTA Chest, no PE h/o aspiration pna and VAP  ?ARDS- ABG consistent with moderate ARDS P:   Continue abx (see below)  Albuterol prn Continue vent support, on ARDS protocol SBT when ready   CARDIOVASCULAR CVL Left IJ 1/27>> A:  Elevated troponin (0.11 , 0.07,0.06 here) and new ST depression > demand ischemia? H/o HTN now hypotensive  CHF, chronic (no echo on file so unspecified) H/o cardiopulmonary arrest 05/2014  Tachycardia  P:   Continue Metoprolol 12.5 mg BID Metoprolol 2.5-5 mg Q6H PRN HR >140 Hold home Clonidine  CVP monitoring Monitor VS. MAP >65 or sbp>90   RENAL A:   Lactic acidosis- resolved. Lactic acid 1.1 Metabolic acidosis Hypokalemia Hypophosphatemia Hypomagnesemia  P:   Bmet in AM Trend electrolytes and replete prn   GASTROINTESTINAL A:   H/o Cirrhosis, portal HTN, ascites likely 2/2 alcohol abuse  H/o C diff 06/01/2014  Transaminitis - mild  NPO with G tube (placed 06/29/14 by IR) P:   Will trend CMET  Continue TFs  HEMATOLOGIC A:   Anemia P:  Trend Hbg VTE ppx- Lovenox SQ   INFECTIOUS A:   Septic shock- on Neo. Likely due to ?HCAP vs. ?aspiration  UTI Leukocytosis with left shift Fever- Tmax 102.5 Thrombocytopenia- plts steadily improving 79>100 P:   BCx2 1/27>> UC 1/27>> Sputum 1/27>> Flu 1/27>>negative  C.diff 1/27>>negative MRSA 1/27>> positive Strep pneumo>> negative Legionella>>negative  Vancomycin 1/27>> Zosyn 1/27>>  Trend CBC, fevers PCT 32>26>16   ENDOCRINE A:   Mild hyperglycemia P:   Continue to monitor blood glucose on bmet Consider ISS if blood  sugars >180  NEUROLOGIC A:   Acute encephalopathy  H/o Alcohol abuse with h/o DTs, h/o heroin abuse  H/o Severe anoxic brain injury  H/o stroke   P:   RASS goal: -1 Continue Propofol gtt Continue Fentanyl prn  Continue Methadone 10 mg bid  Continue Haldol 2 mg bid per peg  Continue Klonopin 1 mg Q8H per peg   FAMILY  - Updates: updated father 1/27. Talked with wife and daughter on 1/28 in regards to patient's baseline and code status. Family notes he is not verbal, but will make attempts to talk and they believe he understands when they are talking to him. He is not able to care for himself in regards to daily tasks of living. Wife and daughter do not know what the patient's wishes were in the event he would need CPR/ACLS. Family does note he would not want to be kept on a ventilator long term. I  expressed to the family to think more about what his wishes would be and to come this morning to discuss goals of care further. Family agreed.  - Inter-disciplinary family meet or Palliative Care meeting due by: 08/09/14    Rich Number, MD Internal Medicine Resident, PGY-1  Attending:  I have seen and examined the patient with nurse practitioner/resident and agree with the note above.   Events of the night noted, mostly related to agitation, sedation issues I think CXR reviewed> he does not have ARDS  Exam: comfortable Lungs: bilateral rhonchi CV: tachy, regular  Impression/Plan: 1) septic shock> wean neo 2) HCAP> continue abx as written 3) Acute respiratory failure> wean vent, change back to standard support 4) Acute on chronic encephalopathy> sedation as needed    I had a lengthy conversation with his wife today.  His baseline mental status is poor and he has essentially no functional capacity.  Based on that I do not feel that he will benefit from ongoing critical care support because his overall prognosis is low.  I gave them three options: 1) continue full support with plans  for resuming long term care, in which case he will need a tracheostomy again soon; 2) full comfort measures, or 3) briefly continue full support and hope for a dramatic recovery in 48 hours.    She opted to continued full support for another 48 hours.  I have made him DNR after our conversation.  If he dramatically worsens I WOULD NOT add more vasopressors.  My cc time 45 minutes  Heber Zeba, MD [place] [facility] Pager: [phone] Cell: [phone] If no response, call [phone]

## 2014-08-04 NOTE — Progress Notes (Signed)
eLink Physician-Brief Progress Note Patient Name: Paul GellJohn Scott DOB: 04/26/1966 MRN: [id number]   Date of Service  08/04/2014  HPI/Events of Note  K, Mg, Ph all low.  eICU Interventions  Will give K-phos 20 mmol, and 1 gm Mg.     Intervention Category Major Interventions: Other:  Paul Scott 08/04/2014, 6:08 AM

## 2014-08-04 NOTE — Progress Notes (Signed)
[facility] FOR AM LAB REPLACEMENT ONLY  The patient does not apply for the [facility] Electrolyte Replac[facility] Protocol based on the criteria listed below:     Is urine output >/= 0.5 ml/kg/hr for the last 6 hours? No. Patient's UOP is 0.3 ml/kg/hr  * Abnormal electrolyte(s):K2.8   If a panic level lab has been reported, has the CCM MD in charge been notified? Yes.  .   Physician:  Thea GistV Sood,MD  Summerlyn Fickel William 08/04/2014 6:08 AM

## 2014-08-04 NOTE — Progress Notes (Signed)
Critical lab value received Blood Culture Anerobic Gram + cocci

## 2014-08-04 NOTE — Progress Notes (Signed)
Will discuss wakeup assessment with MD, patient requires high levels of PEEP on ARDS protocol currently. Sedation minimized.

## 2014-08-04 NOTE — Progress Notes (Addendum)
ANTIBIOTIC CONSULT NOTE - FOLLOW UP  Pharmacy Consult for Vancomycin / Zosyn Indication: Sepsis / PNA? / Bacteremia?  No Known Allergies  Patient Measurements: Height: 5\' 5"  (165.1 cm) Weight: 147 lb 14.9 oz (67.1 kg) IBW/kg (Calculated) : 61.5  Vital Signs: Temp: 100.3 F (37.9 C) (01/29 1230) Temp Source: Core (Comment) (01/29 0400) BP: 98/54 mmHg (01/29 1230) Pulse Rate: 111 (01/29 1230) Intake/Output from previous day: 01/28 0701 - 01/29 0700 In: 6584.9 [I.V.:4921.8; NG/GT:393.2; IV Piggyback:600] Out: 2560 [Urine:2560] Intake/Output from this shift: Total I/O In: 1828.6 [I.V.:758.6; NG/GT:420; IV Piggyback:650] Out: 425 [Urine:425]  Labs:  Recent Labs  07/19/2014 2043 08/03/14 0058 08/03/14 0740 08/04/14 0330  WBC  --  3.5* 5.4 5.9  HGB  --  8.7* 9.0* 8.8*  PLT  --  79* 89* 100*  CREATININE 0.72  --  0.62 0.57   Estimated Creatinine Clearance: 96.1 mL/min (by C-G formula based on Cr of 0.57).  Recent Labs  08/04/14 1215  VANCOTROUGH 25.7*     Microbiology: Recent Results (from the past 720 hour(s))  Clostridium Difficile by PCR     Status: None   Collection Time: 07/06/14  4:26 AM  Result Value Ref Range Status   C difficile by pcr NEGATIVE NEGATIVE Final  Clostridium Difficile by PCR     Status: None   Collection Time: 07/18/14  6:32 AM  Result Value Ref Range Status   C difficile by pcr NEGATIVE NEGATIVE Final  MRSA PCR Screening     Status: Abnormal   Collection Time: 07/24/2014  7:22 PM  Result Value Ref Range Status   MRSA by PCR POSITIVE (A) NEGATIVE Final    Comment:        The GeneXpert MRSA Assay (FDA approved for NASAL specimens only), is one component of a comprehensive MRSA colonization surveillance program. It is not intended to diagnose MRSA infection nor to guide or monitor treatment for MRSA infections. RESULT CALLED TO, READ BACK BY AND VERIFIED WITH: L CAUDLE RN 2107 07/09/2014 A BROWNING   Culture, Urine     Status: None  (Preliminary result)   Collection Time: 07/26/2014  8:12 PM  Result Value Ref Range Status   Specimen Description URINE, CATHETERIZED  Final   Special Requests Normal  Final   Colony Count PENDING  Incomplete   Culture   Final    Culture reincubated for better growth [facility]    Report Status PENDING  Incomplete  Culture, respiratory (NON-Expectorated)     Status: None (Preliminary result)   Collection Time: 07/12/2014  8:38 PM  Result Value Ref Range Status   Specimen Description TRACHEAL ASPIRATE  Final   Special Requests Normal  Final   Gram Stain   Final    ABUNDANT WBC PRESENT,BOTH PMN AND MONONUCLEAR RARE SQUAMOUS EPITHELIAL CELLS PRESENT NO ORGANISMS SEEN [facility]    Culture   Final    Culture reincubated for better growth [facility]formed at [facility]    Report Status PENDING  Incomplete  Culture, blood (routine x 2)     Status: None (Preliminary result)   Collection Time: 07/11/2014  8:43 PM  Result Value Ref Range Status   Specimen Description BLOOD LEFT ARM  Final   Special Requests BOTTLES DRAWN AEROBIC AND ANAEROBIC 10CC EACH  Final   Culture   Final    GRAM POSITIVE COCCI IN CLUSTERS Note: Gram Stain Report Called to,Read Back By and Verified With: JOSH RN ON  4M AT 0945 [id number] BY CASTC Performed at [facility]    Report Status PENDING  Incomplete  Culture, respiratory (NON-Expectorated)     Status: None (Preliminary result)   Collection Time: 08/03/14  8:12 PM  Result Value Ref Range Status   Specimen Description TRACHEAL ASPIRATE  Final   Special Requests NONE  Final   Gram Stain   Final    ABUNDANT WBC PRESENT, PREDOMINANTLY PMN RARE SQUAMOUS EPITHELIAL CELLS PRESENT RARE GRAM POSITIVE COCCI IN PAIRS RARE YEAST Performed at [facility]    Culture PENDING  Incomplete   Report Status PENDING  Incomplete    Anti-infectives    Start     Dose/Rate Route Frequency Ordered Stop    08/03/14 0500  vancomycin (VANCOCIN) IVPB 750 mg/150 ml premix     750 mg150 mL/hr over 60 Minutes Intravenous Every 8 hours 08/31/14 2023     08/03/14 0300  piperacillin-tazobactam (ZOSYN) IVPB 3.375 g     3.375 g12.5 mL/hr over 240 Minutes Intravenous Every 8 hours 2014-08-31 2023     Aug 31, 2014 2100  vancomycin (VANCOCIN) 1,250 mg in sodium chloride 0.9 % 250 mL IVPB     1,250 mg166.7 mL/hr over 90 Minutes Intravenous  Once 08-31-2014 2023 Aug 31, 2014 2318   2014/08/31 2100  piperacillin-tazobactam (ZOSYN) IVPB 3.375 g     3.375 g100 mL/hr over 30 Minutes Intravenous  Once 08-31-14 2023 August 31, 2014 2222      Assessment: 49 yo male here from [facility] (and was admitted there from SNF) with respiratory distress. Was started on Vanc and Zosyn for sepsis. Blood cx's showed 1/2 GPC in clusters. SCr stable and CrCl ~79ml/min. VT drawn today and was supratherapeutic at 25.7 on Vanc  IV Q8. Current dose was held.  Goal of Therapy:  Vancomycin trough level 15-20 mcg/ml  Resolution of infection  Plan:  Stop current vancomycin Restart vancomycin at  IV Q12 at 2000 tonight Zosyn 3.375g IV Q8 Monitor renal function, clinical picture, VT prn F/U C&S, abx LOT  Ileanna Gemmill J 08/04/2014,1:03 PM

## 2014-08-05 DIAGNOSIS — G9340 Encephalopathy, unspecified: Secondary | ICD-10-CM

## 2014-08-05 DIAGNOSIS — A419 Sepsis, unspecified organism: Principal | ICD-10-CM

## 2014-08-05 DIAGNOSIS — J189 Pneumonia, unspecified organism: Secondary | ICD-10-CM

## 2014-08-05 DIAGNOSIS — R6521 Severe sepsis with septic shock: Secondary | ICD-10-CM

## 2014-08-05 DIAGNOSIS — J9601 Acute respiratory failure with hypoxia: Secondary | ICD-10-CM

## 2014-08-05 LAB — GLUCOSE, CAPILLARY
GLUCOSE-CAPILLARY: 93 mg/dL (ref 70–99)
Glucose-Capillary: 118 mg/dL — ABNORMAL HIGH (ref 70–99)
Glucose-Capillary: 78 mg/dL (ref 70–99)
Glucose-Capillary: 86 mg/dL (ref 70–99)
Glucose-Capillary: 96 mg/dL (ref 70–99)
Glucose-Capillary: 98 mg/dL (ref 70–99)

## 2014-08-05 LAB — CULTURE, BLOOD (ROUTINE X 2)

## 2014-08-05 MED ORDER — FENTANYL CITRATE 0.05 MG/ML IJ SOLN
25.0000 ug | INTRAMUSCULAR | Status: DC | PRN
  Administered 2014-08-05 – 2014-08-07 (×3): 100 ug via INTRAVENOUS
  Administered 2014-08-08: 50 ug via INTRAVENOUS
  Filled 2014-08-05 (×4): qty 2

## 2014-08-05 MED ORDER — IPRATROPIUM-ALBUTEROL 0.5-2.5 (3) MG/3ML IN SOLN
3.0000 mL | Freq: Four times a day (QID) | RESPIRATORY_TRACT | Status: DC
  Administered 2014-08-05 – 2014-08-10 (×20): 3 mL via RESPIRATORY_TRACT
  Filled 2014-08-05 (×20): qty 3

## 2014-08-05 MED ORDER — QUETIAPINE FUMARATE 50 MG PO TABS
50.0000 mg | ORAL_TABLET | Freq: Two times a day (BID) | ORAL | Status: DC
  Administered 2014-08-05 – 2014-08-10 (×11): 50 mg
  Filled 2014-08-05 (×12): qty 1

## 2014-08-05 MED ORDER — FREE WATER
100.0000 mL | Freq: Three times a day (TID) | Status: DC
  Administered 2014-08-05 – 2014-08-08 (×8): 100 mL

## 2014-08-05 MED ORDER — DEXTROSE 5 % IV SOLN
2.0000 ug/min | INTRAVENOUS | Status: DC
  Filled 2014-08-05: qty 16

## 2014-08-05 MED ORDER — BUDESONIDE 0.25 MG/2ML IN SUSP
0.2500 mg | Freq: Four times a day (QID) | RESPIRATORY_TRACT | Status: DC
  Administered 2014-08-05 – 2014-08-10 (×18): 0.25 mg via RESPIRATORY_TRACT
  Filled 2014-08-05 (×27): qty 2

## 2014-08-05 NOTE — Progress Notes (Signed)
eLink Physician-Brief Progress Note Patient Name: Paul GellJohn Scott DOB: 07/09/1965 MRN: [id number]   Date of Service  08/05/2014  HPI/Events of Note  Call from nurse reporting increase in TF residuals with current level of 500 cc - had been 200 cc earlier.  Also reporting increase in abd distension.  Patient with cirrhosis/liver failure  eICU Interventions  Plan: Hold TFs for now KUB - ascites vs other        DETERDING,ELIZABETH 08/05/2014, 11:59 PM

## 2014-08-05 NOTE — Progress Notes (Signed)
PULMONARY / CRITICAL CARE MEDICINE   Name: Paul Scott MRN: [id number] DOB: 1965/10/29    ADMISSION DATE:  07/12/2014 CONSULTATION DATE:  07/18/2014   REFERRING MD :  Duke Salvia   CHIEF COMPLAINT:  Respiratory distress   INITIAL PRESENTATION:  49 y.o pmh polysubstance, cirrhosis, portal HTN, stroke, chronic PEG with complicated hospital course recently (see below).  He presents from [facility] to [facility] with severe respiratory distress with hypoxia (77% on room air and 89% on NRB), accessory muscle use, rales,  fever (104), tachycardic to 102.  BP 110/54.  Failed NRB needed intubation transferred to [facility] for further care.   STUDIES:  1/27 CXR>> negative 1/28 CT Angio Chest>> Neg for PE. Multifocal nodular areas of ground glass infiltration bilaterally. Multifocal PNA vs. Atypical PNA (fungal, TB, septic emboli).  1/28 CXR>> Bilateral opacification, worsening at L mid lung  SIGNIFICANT EVENTS: 11/23>> Admit to [facility] with AMS, intubated, Also PEA arrest  12/02>> Tx to [facility] for rehab, on phenobarbitol  12/10>> respiratory and bradycardic arrest, intubated. CT head negative  12/14>>planned extubation  n. Failed with reintubation 12/15  12/16>> trached DF>>1/13 PCCM re contacted for decannulation  12/17>> liberated from vent  12/14>> PEG-IR 1/27>>sent to [facility] from [facility] for respiratory distress, fever, tachycardia  SUBJECTIVE:  RASS -3. Some vent dyssynchrony. Increasing O2 reqts   VITAL SIGNS: Temp:  [96.6 F (35.9 C)-103.4 F (39.7 C)] 96.9 F (36.1 C) (01/30 1200) Pulse Rate:  [80-149] 93 (01/30 1200) Resp:  [9-34] 13 (01/30 1200) BP: (81-143)/(39-101) 101/54 mmHg (01/30 1200) SpO2:  [89 %-100 %] 95 % (01/30 1200) FiO2 (%):  [40 %-60 %] 60 % (01/30 1156) Weight:  [70.7 kg (155 lb 13.8 oz)] 70.7 kg (155 lb 13.8 oz) (01/30 0500) HEMODYNAMICS: CVP:  [8 mmHg-9 mmHg] 8 mmHg VENTILATOR SETTINGS: Vent Mode:  [-]  PRVC FiO2 (%):  [40 %-60 %] 60 % Set Rate:  [14 bmp] 14 bmp Vt Set:  [490 mL] 490 mL PEEP:  [8 cmH20] 8 cmH20 Pressure Support:  [5 cmH20] 5 cmH20 Plateau Pressure:  [19 cmH20-25 cmH20] 25 cmH20 INTAKE / OUTPUT:  Intake/Output Summary (Last 24 hours) at 08/05/14 1245 Last data filed at 08/05/14 1217  Gross per 24 hour  Intake 5856.89 ml  Output   1370 ml  Net 4486.89 ml    PHYSICAL EXAMINATION: General:  Sedated on vent. NAD Neuro:  Responds to noxious stimuli, PERRL HEENT:  [place]/AT, PERRL, ETT in place  Cardiovascular:  RRR, no m/g/r Lungs: rhonchi bilaterally Abdomen: BS+, soft, non-distended. PEG tube in place Musculoskeletal: no edema  Skin: Peg site clean. Upper extremities and face have patchy erythema, likely red man syndrome.   LABS:  CBC  Recent Labs Lab 08/03/14 0058 08/03/14 0740 08/04/14 0330  WBC 3.5* 5.4 5.9  HGB 8.7* 9.0* 8.8*  HCT 26.8* 28.1* 27.8*  PLT 79* 89* 100*   Coag's No results for input(s): APTT, INR in the last 168 hours. BMET  Recent Labs Lab 08/01/2014 2043 08/03/14 0740 08/04/14 0330  NA 137 137 137  K 3.7 3.9 2.8*  CL 105 111 108  CO2 BUN CREATININE 0.72 0.62 0.57  GLUCOSE 102* 107* 117*   Electrolytes  Recent Labs Lab 07/25/2014 2043 08/03/14 0740 08/04/14 0330  CALCIUM 9.0 8.1* 8.6  MG 1.8  --  1.7  PHOS 3.6  --  1.6*   Sepsis Markers  Recent Labs Lab  08/03/2014 2043 08/03/14 0139 08/04/14 0330  LATICACIDVEN 1.7  --   --   PROCALCITON 32.53 26.10 16.82   ABG  Recent Labs Lab 07/22/2014 2017 08/03/14 0421 08/03/14 2023  PHART 7.381 7.290* 7.306*  PCO2ART 38.9 39.8 41.4  PO2ART 105.0* 116.0* 77.0*   Liver Enzymes  Recent Labs Lab 07/22/2014 2043  AST 51*  ALT 32  ALKPHOS 114  BILITOT 1.2  ALBUMIN 2.9*   Cardiac Enzymes  Recent Labs Lab 07/24/2014 2043 08/03/14 0139 08/03/14 0740  TROPONINI 0.07* 0.06* 0.05*   Glucose  Recent Labs Lab 08/04/14 1150 08/04/14 1543  08/04/14 1939 08/04/14 2358 08/05/14 0351 08/05/14 0714  GLUCAP 119* 114* 84 96 98 78    CXR: NNF  ASSESSMENT / PLAN: PULMONARY H/o trach 12/16>>1/13 OETT 1/27 >>  A: Acute hypoxic Respiratory failure Aspiration HCAP P:   Cont vent support - settings reviewed and/or adjusted Wean in PSV as tolerated Cont vent bundle Daily SBT if/when meets criteria Will meet with family 1/31 and discuss one way extubation  CARDIOVASCULAR CVL Left IJ 1/27>> A:  Minimally elevated troponin does not warrant eval presently H/o HTN Septic shock H/O CHF H/o cardiopulmonary arrest 05/2014  Tachycardia  P:   Continue Metoprolol 12.5 mg BID PRN metoprolol to maintain HR < 140 Cont phenylephrine to maintain MAP > 60 mmHg  RENAL A:   Lactic acidosis- resolved Hypokalemia Hypophosphatemia Hypomagnesemia  P:   Monitor BMET intermittently Monitor I/Os Correct electrolytes as indicated  GASTROINTESTINAL A:   Cirrhosis, portal HTN, ascites likely 2/2 alcohol abuse  H/o C diff 06/01/2014  Transaminitis - mild  G tube status P:   SUP: enteral famotidine Cont TFs  HEMATOLOGIC A:   Anemia without acute bleeding Mild thrombocytopenia P:  DVT px: LMWH Monitor CBC intermittently Transfuse per usual ICU guidelines  INFECTIOUS A:   Severe sepsis Elevated PCT HCAP UTI P:   All micro reviewed  Vancomycin 1/27>> Zosyn 1/27>>    ENDOCRINE A:   Mild hyperglycemia P:   Monitor glu on chem panels Consider SSI for glu > 180   NEUROLOGIC A:   Acute on chronic encephalopathy  H/o Alcohol abuse with h/o DTs, h/o heroin abuse  H/o Severe anoxic brain injury  H/o stroke  P:   RASS goal: -1 Continue Propofol gtt Continue Fentanyl prn  Continue Methadone 10 mg bid  Continue Haldol 2 mg bid per peg  Continue Klonopin 1 mg Q8H per peg   FAMILY  - Updates: updated father 1/27. Talked with wife and daughter on 1/28 in regards to patient's baseline and code status. Family  notes he is not verbal, but will make attempts to talk and they believe he understands when they are talking to him. He is not able to care for himself in regards to daily tasks of living. Wife and daughter do not know what the patient's wishes were in the event he would need CPR/ACLS. Family does note he would not want to be kept on a ventilator long term. I expressed to the family to think more about what his wishes would be and to come this morning to discuss goals of care further. Family agreed.  - Inter-disciplinary family meet or Palliative Care meeting due by: 08/09/14    CCM X 40 mins  Billy Fischeravid Simonds, MD ; [facility] Mobile [phone].  After 5:30 PM or weekends, call [phone]

## 2014-08-05 NOTE — Progress Notes (Signed)
Chaplain responded to page that pt father had requested prayer. Chaplain provided prayer around pt with pt family.   Wille GlaserMcCray, Ruthetta Koopmann O, Chaplain 08/05/2014 5:22 PM

## 2014-08-05 NOTE — Progress Notes (Signed)
220ml fentanyl infusion wasted Winfred Leedsyril F RN witnessed  Jacqulyn Canehristopher Scott Bentlie Withem RN, BSN, CCRN

## 2014-08-05 NOTE — Progress Notes (Signed)
Increased fio2 to 60% d/t sat 87-88%.  RN aware.

## 2014-08-05 NOTE — Progress Notes (Signed)
Upon entering room, found sat 85%.  Changed pt back to full vent support, sat 94-95%.

## 2014-08-05 NOTE — Progress Notes (Signed)
Increased fio2 to 50 d/t pt desat to 87% on 40% fio2.  Rn aware.

## 2014-08-06 ENCOUNTER — Inpatient Hospital Stay (HOSPITAL_COMMUNITY): Payer: Medicare Other

## 2014-08-06 DIAGNOSIS — J850 Gangrene and necrosis of lung: Secondary | ICD-10-CM

## 2014-08-06 LAB — CBC
HCT: 28.1 % — ABNORMAL LOW (ref 39.0–52.0)
Hemoglobin: 8.6 g/dL — ABNORMAL LOW (ref 13.0–17.0)
MCH: 28.6 pg (ref 26.0–34.0)
MCHC: 30.6 g/dL (ref 30.0–36.0)
MCV: 93.4 fL (ref 78.0–100.0)
PLATELETS: 71 10*3/uL — AB (ref 150–400)
RBC: 3.01 MIL/uL — ABNORMAL LOW (ref 4.22–5.81)
RDW: 13.8 % (ref 11.5–15.5)
WBC: 2.6 10*3/uL — ABNORMAL LOW (ref 4.0–10.5)

## 2014-08-06 LAB — BASIC METABOLIC PANEL
Anion gap: 5 (ref 5–15)
BUN: 15 mg/dL (ref 6–23)
CO2: 26 mmol/L (ref 19–32)
Calcium: 8.6 mg/dL (ref 8.4–10.5)
Chloride: 109 mmol/L (ref 96–112)
Creatinine, Ser: 0.61 mg/dL (ref 0.50–1.35)
Glucose, Bld: 106 mg/dL — ABNORMAL HIGH (ref 70–99)
Potassium: 4.3 mmol/L (ref 3.5–5.1)
SODIUM: 140 mmol/L (ref 135–145)

## 2014-08-06 LAB — GLUCOSE, CAPILLARY
GLUCOSE-CAPILLARY: 91 mg/dL (ref 70–99)
GLUCOSE-CAPILLARY: 74 mg/dL (ref 70–99)
Glucose-Capillary: 109 mg/dL — ABNORMAL HIGH (ref 70–99)
Glucose-Capillary: 79 mg/dL (ref 70–99)
Glucose-Capillary: 86 mg/dL (ref 70–99)
Glucose-Capillary: 97 mg/dL (ref 70–99)

## 2014-08-06 LAB — TRIGLYCERIDES: TRIGLYCERIDES: 206 mg/dL — AB (ref <150)

## 2014-08-06 MED ORDER — METOCLOPRAMIDE HCL 5 MG/ML IJ SOLN
10.0000 mg | Freq: Three times a day (TID) | INTRAMUSCULAR | Status: AC
  Administered 2014-08-06 – 2014-08-07 (×6): 10 mg via INTRAVENOUS
  Filled 2014-08-06 (×6): qty 2

## 2014-08-06 MED ORDER — SODIUM CHLORIDE 0.9 % IV SOLN
INTRAVENOUS | Status: DC
  Administered 2014-08-06: 04:00:00 via INTRAVENOUS

## 2014-08-06 MED ORDER — METHADONE HCL 5 MG PO TABS
5.0000 mg | ORAL_TABLET | Freq: Every day | ORAL | Status: DC
  Administered 2014-08-07 – 2014-08-10 (×4): 5 mg
  Filled 2014-08-06 (×4): qty 1

## 2014-08-06 MED ORDER — BISACODYL 10 MG RE SUPP
10.0000 mg | Freq: Once | RECTAL | Status: AC
  Administered 2014-08-06: 10 mg via RECTAL
  Filled 2014-08-06: qty 1

## 2014-08-06 MED ORDER — CLONAZEPAM 0.5 MG PO TABS
1.0000 mg | ORAL_TABLET | Freq: Two times a day (BID) | ORAL | Status: DC
  Administered 2014-08-06 – 2014-08-10 (×8): 1 mg
  Filled 2014-08-06 (×8): qty 2

## 2014-08-06 NOTE — Progress Notes (Signed)
Clinical Social Work Department BRIEF PSYCHOSOCIAL ASSESSMENT 08/06/2014  Patient:  Paul Scott, Paul Scott     Account Number:  [id number]     Admit date:  07/16/2014  Clinical Social Worker:  Thalia Party  Date/Time:  08/06/2014 03:02 PM  Referred by:  Physician  Date Referred:  08/04/2014 Referred for  SNF Placement   Other Referral:   Interview type:  Family Other interview type:    PSYCHOSOCIAL DATA Living Status:  FACILITY Admitted from facility:  [place] Level of care:  [place] Primary support name:  Lawernce Earll Primary support relationship to patient:  SPOUSE Degree of support available:   Good degree of support from spouse and his parents.    CURRENT CONCERNS  Other Concerns:    SOCIAL WORK ASSESSMENT / PLAN CSW met with this 49 y/o, married, Caucasian, males family. He is currently sedated and on the vent.  Patient's spouse stated that he has never had PNE till he was hospitalized for 9 weeks approximately 10 weeks ago.  Patient started in [facility] and was transfered to [facility], then he was discharged to a SNF in [facility]. Patient's spouse states the level of care was good, but he only was there a week, now his PNE has gotten worse.  At this point discussion about SNF may be premature.  Patient has been changed to DNR and has a palliative care meeting.   Assessment/plan status:   Other assessment/ plan:   Information/referral to community resources:    PATIENT'S/FAMILY'S RESPONSE TO PLAN OF CARE: After discussing the options the family states returning to [facility] would be acceptable, unless their was a facility closer to them in [place].  Family is preparing for Palliative Care meeting 08/09/14.   [facility] Apolonia Ellwood Richardo Priest ED CSW [phone]

## 2014-08-06 NOTE — Progress Notes (Signed)
PULMONARY / CRITICAL CARE MEDICINE   Name: Paul Scott MRN: [id number] DOB: 04/15/1966    ADMISSION DATE:  07/13/2014 CONSULTATION DATE:  07/15/2014   REFERRING MD :  Duke Salvia   CHIEF COMPLAINT:  Respiratory distress   INITIAL PRESENTATION:  49 y.o pmh polysubstance, cirrhosis, portal HTN, stroke, chronic PEG with complicated hospital course recently (see below).  He presents from [facility] to [facility] with severe respiratory distress with hypoxia (77% on room air and 89% on NRB), accessory muscle use, rales,  fever (104), tachycardic to 102.  BP 110/54.  Failed NRB needed intubation transferred to [facility] for further care.   STUDIES:  1/27 CXR>> negative 1/28 CT Angio Chest>> Neg for PE. Multifocal nodular areas of ground glass infiltration bilaterally. Multifocal PNA vs. Atypical PNA (fungal, TB, septic emboli).  1/28 CXR>> Bilateral opacification, worsening at L mid lung 1/31 AXR>> Gas-filled prominent transverse and ascending colon, likely ileus 1/31 CXR>> increased opacities in RUL and LLL, possible necrotizing PNA   SIGNIFICANT EVENTS: 11/23>> Admit to [facility] with AMS, intubated, Also PEA arrest  12/02>> Tx to [facility] for rehab, on phenobarbitol  12/10>> respiratory and bradycardic arrest, intubated. CT head negative  12/14>>planned extubation  n. Failed with reintubation 12/15  12/16>> trached DF>>1/13 PCCM re contacted for decannulation  12/17>> liberated from vent  12/14>> PEG-IR 1/27>>sent to [facility] from [facility] for respiratory distress, fever, tachycardia 1/29>> Code status changed to DNR  SUBJECTIVE: Overnight, nurse noticed increased TF residuals and increased abd distension. TFs held and KUB shows possible ileus. Patient placed on Reglan 10 mg Iv Q8H.   RASS -3. Increased oxygen requirements.   VITAL SIGNS: Temp:  [96.3 F (35.7 C)-99.2 F (37.3 C)] 97.5 F (36.4 C) (01/31 0700) Pulse Rate:  [85-136] 111  (01/31 0700) Resp:  [11-36] 21 (01/31 0700) BP: (75-122)/(37-96) 75/37 mmHg (01/31 0700) SpO2:  [84 %-97 %] 93 % (01/31 0700) FiO2 (%):  [40 %-70 %] 70 % (01/31 0400) Weight:  [71.5 kg (157 lb 10.1 oz)] 71.5 kg (157 lb 10.1 oz) (01/31 0427) HEMODYNAMICS:   VENTILATOR SETTINGS: Vent Mode:  [-] PRVC FiO2 (%):  [40 %-70 %] 70 % Set Rate:  [14 bmp] 14 bmp Vt Set:  [490 mL] 490 mL PEEP:  [8 cmH20] 8 cmH20 Pressure Support:  [5 cmH20] 5 cmH20 Plateau Pressure:  [25 cmH20] 25 cmH20 INTAKE / OUTPUT:  Intake/Output Summary (Last 24 hours) at 08/06/14 0806 Last data filed at 08/06/14 0600  Gross per 24 hour  Intake 2858.33 ml  Output   1295 ml  Net 1563.33 ml    PHYSICAL EXAMINATION: General:  Sedated on vent. NAD Neuro:  Responds to noxious stimuli, PERRL HEENT:  [place]/AT, PERRL, ETT in place  Cardiovascular:  tachycardic, no m/g/r Lungs: rhonchi bilaterally Abdomen: BS+, soft, non-distended. PEG tube in place Musculoskeletal: no edema  Skin: Peg site clean.   LABS:  CBC  Recent Labs Lab 08/03/14 0740 08/04/14 0330 08/06/14 0430  WBC 5.4 5.9 2.6*  HGB 9.0* 8.8* 8.6*  HCT 28.1* 27.8* 28.1*  PLT 89* 100* 71*   Coag's No results for input(s): APTT, INR in the last 168 hours. BMET  Recent Labs Lab 08/03/14 0740 08/04/14 0330 08/06/14 0430  NA 137 137 140  K 3.9 2.8* 4.3  CL 111 108 109  CO2 BUN CREATININE 0.62 0.57 0.61  GLUCOSE 107* 117* 106*   Electrolytes  Recent Labs  Lab 08/03/2014 2043 08/03/14 0740 08/04/14 0330 08/06/14 0430  CALCIUM 9.0 8.1* 8.6 8.6  MG 1.8  --  1.7  --   PHOS 3.6  --  1.6*  --    Sepsis Markers  Recent Labs Lab 07/19/2014 2043 08/03/14 0139 08/04/14 0330  LATICACIDVEN 1.7  --   --   PROCALCITON 32.53 26.10 16.82   ABG  Recent Labs Lab 07/12/2014 2017 08/03/14 0421 08/03/14 2023  PHART 7.381 7.290* 7.306*  PCO2ART 38.9 39.8 41.4  PO2ART 105.0* 116.0* 77.0*   Liver Enzymes  Recent Labs Lab  07/10/2014 2043  AST 51*  ALT 32  ALKPHOS 114  BILITOT 1.2  ALBUMIN 2.9*   Cardiac Enzymes  Recent Labs Lab 07/08/2014 2043 08/03/14 0139 08/03/14 0740  TROPONINI 0.07* 0.06* 0.05*   Glucose  Recent Labs Lab 08/05/14 1157 08/05/14 1536 08/05/14 1958 08/05/14 2358 08/06/14 0424 08/06/14 0718  GLUCAP 86 93 118* 109* 97 86    CXR: NNF  ASSESSMENT / PLAN: PULMONARY H/o trach 12/16>>1/13 OETT 1/27 >>  A: Acute hypoxic Respiratory failure Aspiration HCAP- concern for necrotizing PNA based on CXR 1/31 P:   Cont vent support - settings reviewed and/or adjusted Wean in PSV as tolerated Cont vent bundle Daily SBT if/when meets criteria Repeat CXR in AM Continue abx as below  CARDIOVASCULAR CVL Left IJ 1/27>> A:  Minimally elevated troponin does not warrant eval presently H/o HTN Septic shock H/O CHF H/o cardiopulmonary arrest 05/2014  Tachycardia  P:   Continue Metoprolol 12.5 mg BID PRN metoprolol to maintain HR < 140 Cont phenylephrine to maintain MAP > 60 mmHg  RENAL A:   Lactic acidosis- resolved Hypokalemia Hypophosphatemia Hypomagnesemia  P:   Monitor BMET intermittently Monitor I/Os Correct electrolytes as indicated  GASTROINTESTINAL A:   Cirrhosis, portal HTN, ascites likely 2/2 alcohol abuse  H/o C diff 06/01/2014  Transaminitis - mild  G tube status P:   SUP: enteral famotidine Cont holding TFs  HEMATOLOGIC A:   Anemia without acute bleeding Mild thrombocytopenia P:  DVT px: LMWH Monitor CBC intermittently Transfuse per usual ICU guidelines  INFECTIOUS A:   Severe sepsis Elevated PCT HCAP- concern for necrotizing PNA UTI P:   All micro reviewed  Vancomycin 1/27>> Zosyn 1/27>>    ENDOCRINE A:   Mild hyperglycemia P:   Monitor glu on chem panels Consider SSI for glu > 180   NEUROLOGIC A:   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 Talked with wife and daughter on 1/28 in regards to patient's baseline and code status. Family notes he is not verbal, but will make attempts to talk and they believe he understands when they are talking to him. He is not able to care for himself in regards to daily tasks of living. Wife and daughter do not know what the patient's wishes were in the event he would need CPR/ACLS. Family does note he would not want to be kept on a ventilator long term. I expressed to the family to think more about what his wishes would be and to come this morning to discuss goals of care  further. Family agreed. On 1/29 family agreed to make patient DNR and have opted to continue full support for the next 48 hours. 1/31- Family wishes to continue full support for the next few days to allow other family members to visit, finalize plans. Family aware of poor prognosis given worsening PNA.  - Inter-disciplinary family meet or Palliative Care meeting due by: 08/09/14    Rich Numberarly Rivet, MD Internal Medicine Resident, PGY-1   PCCM ATTENDING: I have reviewed pt's initial presentation, consultants notes and hospital database in detail.  The above assessment and plan was formulated under my direction.  In summary: Little change overall Remains on high level of vent support CXR worse and appears to demonstrate some cavitary nodules worrisome for a necrotizing process I spent approx 15 mins updating wife and parents. They wish to continue high level of support for a couple more days but will likely wish to proceed with terminal extubation if he fails to demonstrate improvement by the middle to end of the week   35 mins minutes of independent CCM time was provided by me   Billy Fischeravid  Simonds, MD;  PCCM service; Mobile [phone]

## 2014-08-06 NOTE — Progress Notes (Signed)
eLink Physician-Brief Progress Note Patient Name: Paul GellJohn Hammond DOB: 08/29/1965 MRN: [id number]   Date of Service  08/06/2014  HPI/Events of Note  KUB shows distended loops of large intestine suggestive of ileus.  Had BM earlier today.  eICU Interventions  Plan: With increased residuals will try reglan 10 mg IV q8 hours for total of 6 doses.  Continue to monitor        DETERDING,ELIZABETH 08/06/2014, 1:28 AM

## 2014-08-06 NOTE — Progress Notes (Signed)
eLink Physician-Brief Progress Note Patient Name: Earle GellJohn Reever DOB: 05/11/1966 MRN: [id number]   Date of Service  08/06/2014  HPI/Events of Note  abd distention and high residuals despite holding tube feeds.  abd tense; NT.  eICU Interventions  PEG to suction or place OG if needed to suction abd film Clinical f/u     Intervention Category Intermediate Interventions: Abdominal pain - evaluation and management  Henry RusselSMITH, Porsha Skilton, P 08/06/2014, 9:58 PM

## 2014-08-07 ENCOUNTER — Inpatient Hospital Stay (HOSPITAL_COMMUNITY): Payer: Medicare Other

## 2014-08-07 LAB — COMPREHENSIVE METABOLIC PANEL
ALBUMIN: 1.6 g/dL — AB (ref 3.5–5.2)
ALT: 22 U/L (ref 0–53)
ANION GAP: 3 — AB (ref 5–15)
AST: 55 U/L — AB (ref 0–37)
Alkaline Phosphatase: 119 U/L — ABNORMAL HIGH (ref 39–117)
BILIRUBIN TOTAL: 1.2 mg/dL (ref 0.3–1.2)
BUN: 18 mg/dL (ref 6–23)
CO2: 30 mmol/L (ref 19–32)
Calcium: 9.4 mg/dL (ref 8.4–10.5)
Chloride: 107 mmol/L (ref 96–112)
Creatinine, Ser: 0.65 mg/dL (ref 0.50–1.35)
GFR calc non Af Amer: >90 mL/min (ref >90)
GLUCOSE: 95 mg/dL (ref 70–99)
POTASSIUM: 4.3 mmol/L (ref 3.5–5.1)
Sodium: 140 mmol/L (ref 135–145)
Total Protein: 5.2 g/dL — ABNORMAL LOW (ref 6.0–8.3)

## 2014-08-07 LAB — CBC
HCT: 25.6 % — ABNORMAL LOW (ref 39.0–52.0)
Hemoglobin: 8.1 g/dL — ABNORMAL LOW (ref 13.0–17.0)
MCH: 28.6 pg (ref 26.0–34.0)
MCHC: 31.6 g/dL (ref 30.0–36.0)
MCV: 90.5 fL (ref 78.0–100.0)
Platelets: 91 10*3/uL — ABNORMAL LOW (ref 150–400)
RBC: 2.83 MIL/uL — AB (ref 4.22–5.81)
RDW: 13.8 % (ref 11.5–15.5)
WBC: 7.9 10*3/uL (ref 4.0–10.5)

## 2014-08-07 LAB — CULTURE, RESPIRATORY: Special Requests: NORMAL

## 2014-08-07 LAB — GLUCOSE, CAPILLARY
GLUCOSE-CAPILLARY: 71 mg/dL (ref 70–99)
GLUCOSE-CAPILLARY: 76 mg/dL (ref 70–99)
Glucose-Capillary: 77 mg/dL (ref 70–99)
Glucose-Capillary: 75 mg/dL (ref 70–99)
Glucose-Capillary: 81 mg/dL (ref 70–99)
Glucose-Capillary: 78 mg/dL (ref 70–99)

## 2014-08-07 LAB — CULTURE, RESPIRATORY W GRAM STAIN

## 2014-08-07 MED ORDER — PIPERACILLIN-TAZOBACTAM 3.375 G IVPB
3.3750 g | Freq: Three times a day (TID) | INTRAVENOUS | Status: AC
  Administered 2014-08-07 – 2014-08-08 (×4): 3.375 g via INTRAVENOUS
  Filled 2014-08-07 (×4): qty 50

## 2014-08-07 MED ORDER — CIPROFLOXACIN IN D5W 400 MG/200ML IV SOLN
400.0000 mg | Freq: Two times a day (BID) | INTRAVENOUS | Status: DC
  Filled 2014-08-07 (×2): qty 200

## 2014-08-07 NOTE — Progress Notes (Signed)
UR Completed.  [phone]  

## 2014-08-07 NOTE — Progress Notes (Signed)
ANTIBIOTIC CONSULT NOTE - FOLLOW UP  Pharmacy Consult for Vancomycin / Zosyn Indication: Sepsis / PNA? / Bacteremia?  No Known Allergies  Patient Measurements: Height:  (165.1 cm) Weight: 159 lb 9.8 oz (72.4 kg) IBW/kg (Calculated) : 61.5  Vital Signs: Temp: 96.4 F (35.8 C) (02/01 0845) Temp Source: Core (Comment) (02/01 0800) BP: 93/65 mmHg (02/01 0845) Pulse Rate: 149 (02/01 0845) Intake/Output from previous day: 01/31 0701 - 02/01 0700 In: 2200.6 [I.V.:1750.6; IV Piggyback:450] Out: 2530 [Urine:1380; Emesis/NG output:400; Drains:750] Intake/Output from this shift: Total I/O In: 68.7 [I.V.:68.7] Out: 300 [Urine:300]  Labs:  Recent Labs  08/06/14 0430 08/07/14 0440  WBC 2.6* 7.9  HGB 8.6* 8.1*  PLT 71* 91*  CREATININE 0.61 0.65   Estimated Creatinine Clearance: 96.1 mL/min (by C-G formula based on Cr of 0.65).  Recent Labs  08/04/14 1215  VANCOTROUGH 25.7*     Microbiology: Recent Results (from the past 720 hour(s))  Clostridium Difficile by PCR     Status: None   Collection Time: 07/18/14  6:32 AM  Result Value Ref Range Status   C difficile by pcr NEGATIVE NEGATIVE Final  MRSA PCR Screening     Status: Abnormal   Collection Time: 2014-08-18  7:22 PM  Result Value Ref Range Status   MRSA by PCR POSITIVE (A) NEGATIVE Final    Comment:        The GeneXpert MRSA Assay (FDA approved for NASAL specimens only), is one component of a comprehensive MRSA colonization surveillance program. It is not intended to diagnose MRSA infection nor to guide or monitor treatment for MRSA infections. RESULT CALLED TO, READ BACK BY AND VERIFIED WITH: L CAUDLE RN 2107 Aug 18, 2014 A BROWNING   Culture, Urine     Status: None   Collection Time: 08-18-2014  8:12 PM  Result Value Ref Range Status   Specimen Description URINE, CATHETERIZED  Final   Special Requests Normal  Final   Colony Count   Final    >=100,000 COLONIES/ML Performed at [facility]     Culture   Final    Multiple bacterial morphotypes present, none predominant. Suggest appropriate recollection if clinically indicated. Performed at [facility]    Report Status 08/04/2014 FINAL  Final  Culture, respiratory (NON-Expectorated)     Status: None (Preliminary result)   Collection Time: 18-Aug-2014  8:38 PM  Result Value Ref Range Status   Specimen Description TRACHEAL ASPIRATE  Final   Special Requests Normal  Final   Gram Stain   Final    ABUNDANT WBC PRESENT,BOTH PMN AND MONONUCLEAR RARE SQUAMOUS EPITHELIAL CELLS PRESENT NO ORGANISMS SEEN Performed at [facility]    Culture   Final    MODERATE STAPHYLOCOCCUS AUREUS Note: RIFAMPIN AND GENTAMICIN SHOULD NOT BE USED AS SINGLE DRUGS FOR TREATMENT OF STAPH INFECTIONS. MODERATE PSEUDOMONAS AERUGINOSA Performed at [facility]    Report Status PENDING  Incomplete  Culture, blood (routine x 2)     Status: None   Collection Time: 08/18/14  8:43 PM  Result Value Ref Range Status   Specimen Description BLOOD LEFT ARM  Final   Special Requests BOTTLES DRAWN AEROBIC AND ANAEROBIC 10CC EACH  Final   Culture   Final    STAPHYLOCOCCUS SPECIES (COAGULASE NEGATIVE) Note: THE SIGNIFICANCE OF ISOLATING THIS ORGANISM FROM A SINGLE VENIPUNCTURE CANNOT BE PREDICTED WITHOUT FURTHER CLINICAL AND CULTURE CORRELATION. SUSCEPTIBILITIES AVAILABLE ONLY ON REQUEST. Note: Gram Stain Report Called to,Read Back By and Verified With: JOSH RN  ON 16M AT 0945 [id number] BY CASTC [facility] at [facility]    Report Status 08/05/2014 FINAL  Final  Culture, respiratory (NON-Expectorated)     Status: None (Preliminary result)   Collection Time: 08/03/14  8:12 PM  Result Value Ref Range Status   Specimen Description TRACHEAL ASPIRATE  Final   Special Requests NONE  Final   Gram Stain   Final    ABUNDANT WBC PRESENT, PREDOMINANTLY PMN RARE SQUAMOUS EPITHELIAL CELLS PRESENT RARE GRAM POSITIVE COCCI IN PAIRS RARE  YEAST [facility] at [facility]    Culture PENDING  Incomplete   Report Status PENDING  Incomplete    Anti-infectives    Start     Dose/Rate Route Frequency Ordered Stop   08/04/14 2000  vancomycin (VANCOCIN) IVPB 750 mg/150 ml premix     750 mg150 mL/hr over 60 Minutes Intravenous Every 12 hours 08/04/14 1331     08/03/14 0500  vancomycin (VANCOCIN) IVPB 750 mg/150 ml premix  Status:  Discontinued     750 mg150 mL/hr over 60 Minutes Intravenous Every 8 hours 07/09/2014 2023 08/04/14 1311   08/03/14 0300  piperacillin-tazobactam (ZOSYN) IVPB 3.375 g     3.375 g12.5 mL/hr over 240 Minutes Intravenous Every 8 hours 07/19/2014 2023     07/30/2014 2100  vancomycin (VANCOCIN) 1,250 mg in sodium chloride 0.9 % 250 mL IVPB     1,250 mg166.7 mL/hr over 90 Minutes Intravenous  Once 07/12/2014 2023 07/08/2014 2318   07/29/2014 2100  piperacillin-tazobactam (ZOSYN) IVPB 3.375 g     3.375 g100 mL/hr over 30 Minutes Intravenous  Once 08/05/2014 2023 07/29/2014 2222      Assessment: 49 [facility]) with respiratory distress now on day # 6 of vancomycin and Zosyn for possible sepsis/HCAP.   WBC wnl, Hypothermic, SCr stable and CrCl ~2290ml/min.   1/27 Resp Cx >> mod staph aureus 1/27 Blood Cx >> 1/2 CoNS 1/27 Urine Cx >> multiple, none-predominant  Goal of Therapy:  Vancomycin trough level 15-20 mcg/ml  Resolution of infection  Plan:  Continue vancomycin at 750mg  IV Q12 Continue Zosyn 3.375g IV Q8 Monitor renal function, clinical picture, VT prn F/U C&S, abx LOT  Link SnufferJessica Olof Marcil, PharmD, BCPS Clinical Pharmacist [phone] 08/07/2014,9:22 AM

## 2014-08-07 NOTE — Progress Notes (Signed)
PULMONARY / CRITICAL CARE MEDICINE   Name: Paul Scott MRN: [id number] DOB: 08-07-1965    ADMISSION DATE:  10-Aug-2014 CONSULTATION DATE:  08/16/2014   REFERRING MD :  Duke Salvia   CHIEF COMPLAINT:  Respiratory distress   INITIAL PRESENTATION:  49 y.o pmh polysubstance, cirrhosis, portal HTN, stroke, chronic PEG with complicated hospital course recently (see below).  He presents from [facility] to [facility] with severe respiratory distress with hypoxia (77% on room air and 89% on NRB), accessory muscle use, rales,  fever (104), tachycardic to 102.  BP 110/54.  Failed NRB needed intubation transferred to [facility] for further care.   STUDIES:  1/27 CXR>> negative 1/28 CT Angio Chest>> Neg for PE. Multifocal nodular areas of ground glass infiltration bilaterally. Multifocal PNA vs. Atypical PNA (fungal, TB, septic emboli).  1/28 CXR>> Bilateral opacification, worsening at L mid lung 1/31 AXR>> Gas-filled prominent transverse and ascending colon, likely ileus 1/31 CXR>> increased opacities in RUL and LLL, possible necrotizing PNA   SIGNIFICANT EVENTS: 11/23>> Admit to [facility] with AMS, intubated, Also PEA arrest  12/02>> Tx to [facility] for rehab, on phenobarbitol  12/10>> respiratory and bradycardic arrest, intubated. CT head negative  12/14>>planned extubation  n. Failed with reintubation 12/15  12/16>> trached DF>>1/13 PCCM re contacted for decannulation  12/17>> liberated from vent  12/14>> PEG-IR 1/27>>sent to [facility] from [facility] for respiratory distress, fever, tachycardia 1/29>> Code status changed to DNR  SUBJECTIVE:  Pressor needs increasing last 24 hours Remains heavily sedated  VITAL SIGNS: Temp:  [96 F (35.6 C)-99.5 F (37.5 C)] 99.5 F (37.5 C) (02/01 1402) Pulse Rate:  [58-159] 79 (02/01 1402) Resp:  [20-36] 22 (02/01 1402) BP: (75-119)/(44-75) 94/48 mmHg (02/01 1402) SpO2:  [91 %-100 %] 100 % (02/01 1402) FiO2 (%):   [60 %] 60 % (02/01 1402) Weight:  [72.4 kg (159 lb 9.8 oz)] 72.4 kg (159 lb 9.8 oz) (02/01 0500) HEMODYNAMICS:   VENTILATOR SETTINGS: Vent Mode:  [-] PRVC FiO2 (%):  [60 %] 60 % Set Rate:  [14 bmp] 14 bmp Vt Set:  [490 mL] 490 mL PEEP:  [8 cmH20] 8 cmH20 Plateau Pressure:  [19 cmH20-27 cmH20] 27 cmH20 INTAKE / OUTPUT:  Intake/Output Summary (Last 24 hours) at 08/07/14 1504 Last data filed at 08/07/14 1300  Gross per 24 hour  Intake 1888.45 ml  Output   3250 ml  Net -1361.55 ml    PHYSICAL EXAMINATION: General:  Sedated on vent. NAD Neuro:  Responds to noxious stimuli, PERRL HEENT:  [place]/AT, PERRL, ETT in place  Cardiovascular:  tachycardic, no m/g/r Lungs: rhonchi bilaterally Abdomen: BS+, soft, non-distended. PEG tube in place Musculoskeletal: no edema  Skin: Peg site clean.   LABS:  CBC  Recent Labs Lab 08/04/14 0330 08/06/14 0430 08/07/14 0440  WBC 5.9 2.6* 7.9  HGB 8.8* 8.6* 8.1*  HCT 27.8* 28.1* 25.6*  PLT 100* 71* 91*   Coag's No results for input(s): APTT, INR in the last 168 hours. BMET  Recent Labs Lab 08/04/14 0330 08/06/14 0430 08/07/14 0440  NA 137 140 140  K 2.8* 4.3 4.3  CL 108 109 107  CO2 BUN CREATININE 0.57 0.61 0.65  GLUCOSE 117* 106* 95   Electrolytes  Recent Labs Lab 08/30/2014 2043  08/04/14 0330 08/06/14 0430 08/07/14 0440  CALCIUM 9.0  < > 8.6 8.6 9.4  MG 1.8  --  1.7  --   --  PHOS 3.6  --  1.6*  --   --   < > = values in this interval not displayed. Sepsis Markers  Recent Labs Lab 12/20/2014 2043 08/03/14 0139 08/04/14 0330  LATICACIDVEN 1.7  --   --   PROCALCITON 32.53 26.10 16.82   ABG  Recent Labs Lab 12/20/2014 2017 08/03/14 0421 08/03/14 2023  PHART 7.381 7.290* 7.306*  PCO2ART 38.9 39.8 41.4  PO2ART 105.0* 116.0* 77.0*   Liver Enzymes  Recent Labs Lab 12/20/2014 2043 08/07/14 0440  AST 51* 55*  ALT 32 22  ALKPHOS 114 119*  BILITOT 1.2 1.2  ALBUMIN 2.9* 1.6*   Cardiac  Enzymes  Recent Labs Lab 12/20/2014 2043 08/03/14 0139 08/03/14 0740  TROPONINI 0.07* 0.06* 0.05*   Glucose  Recent Labs Lab 08/06/14 1540 08/06/14 1946 08/07/14 0009 08/07/14 0406 08/07/14 0827 08/07/14 1253  GLUCAP 79 74 76 77 75 81    CXR: NNF  ASSESSMENT / PLAN: PULMONARY H/o trach 12/16>>1/13 OETT 1/27 >>  A: Acute hypoxic Respiratory failure Aspiration HCAP- concern for necrotizing PNA based on CXR 1/31 P:   Cont vent support  Continue to attempt to wean PEEP and FiO2 Cont vent bundle Daily SBT if/when meets criteria Follow CXR Continue abx as below  CARDIOVASCULAR CVL Left IJ 1/27>> A:  Minimally elevated troponin does not warrant eval presently H/o HTN Septic shock H/O CHF H/o cardiopulmonary arrest 05/2014  Tachycardia  P:   Hold Metoprolol given increasing pressor needs PRN metoprolol to maintain HR < 140 Cont phenylephrine to maintain MAP > 60 mmHg  RENAL A:   Lactic acidosis- resolved Hypokalemia Hypophosphatemia Hypomagnesemia  P:   Monitor BMET intermittently Monitor I/Os Correct electrolytes as indicated  GASTROINTESTINAL A:   Cirrhosis, portal HTN, ascites likely 2/2 alcohol abuse  H/o C diff 06/01/2014  Transaminitis - mild  G tube status P:   SUP: enteral famotidine Cont holding TFs  HEMATOLOGIC A:   Anemia without acute bleeding Mild thrombocytopenia P:  DVT px: LMWH Monitor CBC intermittently Transfuse per usual ICU guidelines  INFECTIOUS A:   Severe sepsis Elevated PCT HCAP- concern for necrotizing PNA UTI P:   Resp 1/27 >> MRSA and pseudomonas (sens) Blood 1/27 >> 1 of 2 coag neg staph UTI 1/27 >> multiple morphotypes   Vancomycin 1/27>> Zosyn 1/27>>   ENDOCRINE A:   Mild hyperglycemia P:   Monitor glu on chem panels Consider SSI for glu > 180   NEUROLOGIC A:   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 Talked with wife and daughter on 1/28 in regards to patient's baseline and code status. Family notes he is not verbal, but will make attempts to talk and they believe he understands when they are talking to him. He is not able to care for himself in regards to daily tasks of living. Wife and daughter do not know what the patient's wishes were in the event he would need CPR/ACLS. Family does note he would not want to be kept on a ventilator long term. I expressed to the family to think more about what his wishes would be and to come this morning to discuss goals of care  further. Family agreed. On 1/29 family agreed to make patient DNR and have opted to continue full support for the next 48 hours. 1/31- Family wishes to continue full support for the next few days to allow other family members to visit, finalize plans. Family aware of poor prognosis given worsening PNA.  - Father updated by Dr Delton Coombes 2/1. We have agreed to watch for improvement or decline for another 1-2 days. Depending on the trend we will decide regarding goals for care.   - Inter-disciplinary family meet or Palliative Care meeting due by: 08/09/14    Independent CC time 40 minutes  Levy Pupa, MD, PhD 08/07/2014, 3:06 PM [place] [facility] [phone] or if no answer [phone]

## 2014-08-07 DEATH — deceased

## 2014-08-08 ENCOUNTER — Inpatient Hospital Stay (HOSPITAL_COMMUNITY): Payer: Medicare Other

## 2014-08-08 LAB — BASIC METABOLIC PANEL
ANION GAP: 3 — AB (ref 5–15)
BUN: 17 mg/dL (ref 6–23)
CO2: 30 mmol/L (ref 19–32)
CREATININE: 0.6 mg/dL (ref 0.50–1.35)
Calcium: 9.6 mg/dL (ref 8.4–10.5)
Chloride: 108 mmol/L (ref 96–112)
GFR calc Af Amer: >90 mL/min (ref >90)
GFR calc non Af Amer: >90 mL/min (ref >90)
GLUCOSE: 89 mg/dL (ref 70–99)
Potassium: 4.1 mmol/L (ref 3.5–5.1)
SODIUM: 141 mmol/L (ref 135–145)

## 2014-08-08 LAB — RESPIRATORY VIRUS PANEL
ADENOVIRUS: NEGATIVE
INFLUENZA A: NEGATIVE
Influenza B: NEGATIVE
Metapneumovirus: NEGATIVE
Parainfluenza 1: NEGATIVE
Parainfluenza 2: NEGATIVE
Parainfluenza 3: NEGATIVE
Respiratory Syncytial Virus A: NEGATIVE
Respiratory Syncytial Virus B: NEGATIVE
Rhinovirus: NEGATIVE

## 2014-08-08 LAB — MAGNESIUM: MAGNESIUM: 1.6 mg/dL (ref 1.5–2.5)

## 2014-08-08 LAB — TRIGLYCERIDES: Triglycerides: 224 mg/dL — ABNORMAL HIGH (ref <150)

## 2014-08-08 LAB — GLUCOSE, CAPILLARY
GLUCOSE-CAPILLARY: 101 mg/dL — AB (ref 70–99)
GLUCOSE-CAPILLARY: 94 mg/dL (ref 70–99)
Glucose-Capillary: 81 mg/dL (ref 70–99)
Glucose-Capillary: 85 mg/dL (ref 70–99)
Glucose-Capillary: 87 mg/dL (ref 70–99)
Glucose-Capillary: 89 mg/dL (ref 70–99)

## 2014-08-08 LAB — PHOSPHORUS: Phosphorus: 2.7 mg/dL (ref 2.3–4.6)

## 2014-08-08 MED ORDER — SODIUM CHLORIDE 0.9 % IV SOLN
25.0000 ug/h | INTRAVENOUS | Status: DC
  Administered 2014-08-08: 50 ug/h via INTRAVENOUS
  Administered 2014-08-09: 125 ug/h via INTRAVENOUS
  Filled 2014-08-08 (×2): qty 50

## 2014-08-08 MED ORDER — METOPROLOL TARTRATE 1 MG/ML IV SOLN
10.0000 mg | Freq: Once | INTRAVENOUS | Status: AC
  Administered 2014-08-08: 5 mg via INTRAVENOUS
  Filled 2014-08-08: qty 10

## 2014-08-08 MED ORDER — MAGNESIUM SULFATE 2 GM/50ML IV SOLN
2.0000 g | Freq: Once | INTRAVENOUS | Status: AC
  Administered 2014-08-08: 2 g via INTRAVENOUS
  Filled 2014-08-08: qty 50

## 2014-08-08 NOTE — Progress Notes (Signed)
[facility] FOR AM LAB REPLACEMENT ONLY  The patient does [facility]ly[facility] Protocol based on the criteria listed below:   1. Is GFR >/= 40 ml/min? Yes.    Patient's GFR today is >90 2. Is urine output >/= 0.5 ml/kg/hr for the last 6 hours? Yes.   Patient's UOP is 0.7 ml/kg/hr 3. Is BUN < 60 mg/dL? Yes.    Patient's BUN today is 17 4. Abnormal electrolyte(s): Mag 1.6 5. Ordered repletion with: per protocol 6. If a panic level lab has been reported, has the CCM MD in charge been notified? No..   Physician:    Markus DaftWHELAN, Myrtle Barnhard A 08/08/2014 5:28 AM

## 2014-08-08 NOTE — Progress Notes (Addendum)
eLink Physician-Brief Progress Note Patient Name: Paul GellJohn Scott DOB: 11/04/1965 MRN: [id number]   Date of Service  08/08/2014  HPI/Events of Note  Patient with air trapping, agitation and desaturation.  Peep and fio2 changed earlier  eICU Interventions  Attempted to wean peep down further to 5, worsening desaturation. Inc Peep back to 10, FIO2=50%, Fentanyl 50mcg x1 CXR     Intervention Category Major Interventions: Airway management  Jerremy Maione 08/08/2014, 4:53 PM

## 2014-08-08 NOTE — Progress Notes (Signed)
PULMONARY / CRITICAL CARE MEDICINE   Name: Paul Scott MRN: [id number] DOB: 12-29-1965    ADMISSION DATE:  August 26, 2014 CONSULTATION DATE:  2014-08-26   REFERRING MD :  Duke Salvia   CHIEF COMPLAINT:  Respiratory distress   INITIAL PRESENTATION:  49 y.o pmh polysubstance, cirrhosis, portal HTN, stroke, chronic PEG with complicated hospital course recently (see below).  He presents from [facility] to [facility] with severe respiratory distress with hypoxia (77% on room air and 89% on NRB), accessory muscle use, rales,  fever (104), tachycardic to 102.  BP 110/54.  Failed NRB needed intubation transferred to [facility] for further care.   STUDIES:  1/27 CXR>> negative 1/28 CT Angio Chest>> Neg for PE. Multifocal nodular areas of ground glass infiltration bilaterally. Multifocal PNA vs. Atypical PNA (fungal, TB, septic emboli).  1/28 CXR>> Bilateral opacification, worsening at L mid lung 1/31 AXR>> Gas-filled prominent transverse and ascending colon, likely ileus 1/31 CXR>> increased opacities in RUL and LLL, possible necrotizing PNA   SIGNIFICANT EVENTS: 11/23>> Admit to [facility] with AMS, intubated, Also PEA arrest  12/02>> Tx to [facility] for rehab, on phenobarbitol  12/10>> respiratory and bradycardic arrest, intubated. CT head negative  12/14>>planned extubation  n. Failed with reintubation 12/15  12/16>> trached DF>>1/13 PCCM re contacted for decannulation  12/17>> liberated from vent  12/14>> PEG-IR 1/27>>sent to [facility] from [facility] for respiratory distress, fever, tachycardia 1/29>> Code status changed to DNR  SUBJECTIVE:  No significant clinical changes  VITAL SIGNS: Temp:  [98.8 F (37.1 C)-101.9 F (38.8 C)] 99 F (37.2 C) (02/02 1230) Pulse Rate:  [76-140] 89 (02/02 1230) Resp:  [21-38] 26 (02/02 1230) BP: (84-155)/(40-80) 91/46 mmHg (02/02 1230) SpO2:  [90 %-100 %] 97 % (02/02 1230) FiO2 (%):  [60 %] 60 % (02/02  1200) Weight:  [70.8 kg (156 lb 1.4 oz)] 70.8 kg (156 lb 1.4 oz) (02/02 0600) HEMODYNAMICS:   VENTILATOR SETTINGS: Vent Mode:  [-] PRVC FiO2 (%):  [60 %] 60 % Set Rate:  [14 bmp] 14 bmp Vt Set:  [490 mL] 490 mL PEEP:  [8 cmH20] 8 cmH20 Plateau Pressure:  [22 cmH20-24 cmH20] 22 cmH20 INTAKE / OUTPUT:  Intake/Output Summary (Last 24 hours) at 08/08/14 1250 Last data filed at 08/08/14 1200  Gross per 24 hour  Intake 2388.43 ml  Output   1490 ml  Net 898.43 ml    PHYSICAL EXAMINATION: General:  Sedated on vent. NAD Neuro:  Responds to noxious stimuli, PERRL HEENT:  [place]/AT, PERRL, ETT in place  Cardiovascular:  tachycardic, no m/g/r Lungs: rhonchi bilaterally Abdomen: BS+, soft, non-distended. PEG tube in place Musculoskeletal: no edema  Skin: Peg site clean.   LABS:  CBC  Recent Labs Lab 08/04/14 0330 08/06/14 0430 08/07/14 0440  WBC 5.9 2.6* 7.9  HGB 8.8* 8.6* 8.1*  HCT 27.8* 28.1* 25.6*  PLT 100* 71* 91*   Coag's No results for input(s): APTT, INR in the last 168 hours. BMET  Recent Labs Lab 08/06/14 0430 08/07/14 0440 08/08/14 0230  NA 140 140 141  K 4.3 4.3 4.1  CL 109 107 108  CO2 BUN CREATININE 0.61 0.65 0.60  GLUCOSE 106* 95 89   Electrolytes  Recent Labs Lab 2014-08-26 2043  08/04/14 0330 08/06/14 0430 08/07/14 0440 08/08/14 0230  CALCIUM 9.0  < > 8.6 8.6 9.4 9.6  MG 1.8  --  1.7  --   --  1.6  PHOS 3.6  --  1.6*  --   --  2.7  < > = values in this interval not displayed. Sepsis Markers  Recent Labs Lab 07/24/2014 2043 08/03/14 0139 08/04/14 0330  LATICACIDVEN 1.7  --   --   PROCALCITON 32.53 26.10 16.82   ABG  Recent Labs Lab 07/10/2014 2017 08/03/14 0421 08/03/14 2023  PHART 7.381 7.290* 7.306*  PCO2ART 38.9 39.8 41.4  PO2ART 105.0* 116.0* 77.0*   Liver Enzymes  Recent Labs Lab 08/03/2014 2043 08/07/14 0440  AST 51* 55*  ALT 32 22  ALKPHOS 114 119*  BILITOT 1.2 1.2  ALBUMIN 2.9* 1.6*   Cardiac  Enzymes  Recent Labs Lab 07/13/2014 2043 08/03/14 0139 08/03/14 0740  TROPONINI 0.07* 0.06* 0.05*   Glucose  Recent Labs Lab 08/07/14 1550 08/07/14 1932 08/08/14 0012 08/08/14 0425 08/08/14 0805 08/08/14 1138  GLUCAP 78 71 87 85 89 101*    CXR: NNF  ASSESSMENT / PLAN: PULMONARY H/o trach 12/16>>1/13 OETT 1/27 >>  A: Acute hypoxic Respiratory failure Aspiration HCAP- concern for necrotizing PNA based on CXR 1/31 P:   Cont vent support  Continue to attempt to wean PEEP and FiO2 Cont vent bundle Daily SBT if/when meets criteria Follow CXR Continue abx as below  CARDIOVASCULAR CVL Left IJ 1/27>> A:  Minimally elevated troponin does not warrant eval presently H/o HTN Septic shock H/O CHF H/o cardiopulmonary arrest 05/2014  Tachycardia  P:   Hold Metoprolol given increasing pressor needs PRN metoprolol to maintain HR < 140 Cont phenylephrine to maintain MAP > 60 mmHg  RENAL A:   Lactic acidosis- resolved Hypokalemia Hypophosphatemia Hypomagnesemia  P:   Monitor BMET intermittently Monitor I/Os Correct electrolytes as indicated  GASTROINTESTINAL A:   Cirrhosis, portal HTN, ascites likely 2/2 alcohol abuse  H/o C diff 06/01/2014  Transaminitis - mild  G tube status P:   SUP: enteral famotidine Cont holding TFs  HEMATOLOGIC A:   Anemia without acute bleeding Mild thrombocytopenia P:  DVT px: LMWH Monitor CBC intermittently Transfuse per usual ICU guidelines  INFECTIOUS A:   Severe sepsis Elevated PCT HCAP- concern for necrotizing PNA UTI P:   Resp 1/27 >> MRSA and pseudomonas (sens) Blood 1/27 >> 1 of 2 coag neg staph UTI 1/27 >> multiple morphotypes   Vancomycin 1/27>> Zosyn 1/27>>   ENDOCRINE A:   Mild hyperglycemia P:   Monitor glu on chem panels Consider SSI for glu > 180   NEUROLOGIC A:   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 Talked with wife and daughter on 1/28 in regards to patient's baseline and code status. Family notes he is not verbal, but will make attempts to talk and they believe he understands when they are talking to him. He is not able to care for himself in regards to daily tasks of living. Wife and daughter do not know what the patient's wishes were in the event he would need CPR/ACLS. Family does note he would not want to be kept on a ventilator long term. I expressed to the family to think more about what his wishes would be and to come this morning to discuss goals of  care further. Family agreed. On 1/29 family agreed to make patient DNR and have opted to continue full support for the next 48 hours. 1/31- Family wishes to continue full support for the next few days to allow other family members to visit, finalize plans. Family aware of poor prognosis given worsening PNA.  - Father updated by Dr Delton CoombesByrum 2/1. We have agreed to watch for improvement or decline for another 1-2 days. Depending on the trend we will decide regarding goals for care.   - Inter-disciplinary family meet or Palliative Care meeting due by: 08/09/14    Independent CC time 40 minutes  Levy Pupaobert Obrian Bulson, MD, PhD 08/08/2014, 12:50 PM  [facility] [phone] or if no answer [phone]

## 2014-08-09 ENCOUNTER — Inpatient Hospital Stay (HOSPITAL_COMMUNITY): Payer: Medicare Other

## 2014-08-09 LAB — GLUCOSE, CAPILLARY
GLUCOSE-CAPILLARY: 87 mg/dL (ref 70–99)
GLUCOSE-CAPILLARY: 84 mg/dL (ref 70–99)
Glucose-Capillary: 82 mg/dL (ref 70–99)
Glucose-Capillary: 82 mg/dL (ref 70–99)
Glucose-Capillary: 87 mg/dL (ref 70–99)
Glucose-Capillary: 97 mg/dL (ref 70–99)

## 2014-08-09 LAB — CBC
HCT: 25.2 % — ABNORMAL LOW (ref 39.0–52.0)
Hemoglobin: 8.1 g/dL — ABNORMAL LOW (ref 13.0–17.0)
MCH: 28.7 pg (ref 26.0–34.0)
MCHC: 32.1 g/dL (ref 30.0–36.0)
MCV: 89.4 fL (ref 78.0–100.0)
Platelets: 102 10*3/uL — ABNORMAL LOW (ref 150–400)
RBC: 2.82 MIL/uL — ABNORMAL LOW (ref 4.22–5.81)
RDW: 14.1 % (ref 11.5–15.5)
WBC: 5.4 10*3/uL (ref 4.0–10.5)

## 2014-08-09 LAB — BASIC METABOLIC PANEL
ANION GAP: 3 — AB (ref 5–15)
BUN: 15 mg/dL (ref 6–23)
CO2: 34 mmol/L — ABNORMAL HIGH (ref 19–32)
CREATININE: 0.53 mg/dL (ref 0.50–1.35)
Calcium: 9.4 mg/dL (ref 8.4–10.5)
Chloride: 104 mmol/L (ref 96–112)
GFR calc non Af Amer: >90 mL/min (ref >90)
Glucose, Bld: 94 mg/dL (ref 70–99)
Potassium: 3.7 mmol/L (ref 3.5–5.1)
Sodium: 141 mmol/L (ref 135–145)

## 2014-08-09 LAB — MAGNESIUM: Magnesium: 1.5 mg/dL (ref 1.5–2.5)

## 2014-08-09 MED ORDER — METOPROLOL TARTRATE 1 MG/ML IV SOLN
5.0000 mg | Freq: Four times a day (QID) | INTRAVENOUS | Status: DC | PRN
  Administered 2014-08-09: 5 mg via INTRAVENOUS
  Filled 2014-08-09: qty 5

## 2014-08-09 NOTE — Progress Notes (Signed)
PULMONARY / CRITICAL CARE MEDICINE   Name: Paul Scott MRN: [id number] DOB: [birth year]    ADMISSION DATE:  08/01/2014 CONSULTATION DATE:  07/21/2014   REFERRING MD :  Duke Salvia   CHIEF COMPLAINT:  Respiratory distress   INITIAL PRESENTATION:  49 y.o pmh polysubstance, cirrhosis, portal HTN, stroke, chronic PEG with complicated hospital course recently (see below).  He presents from [facility] to [facility] with severe respiratory distress with hypoxia (77% on room air and 89% on NRB), accessory muscle use, rales,  fever (104), tachycardic to 102.  BP 110/54.  Failed NRB needed intubation transferred to [facility] for further care.   STUDIES:  1/27 CXR>> negative 1/28 CT Angio Chest>> Neg for PE. Multifocal nodular areas of ground glass infiltration bilaterally. Multifocal PNA vs. Atypical PNA (fungal, TB, septic emboli).  1/28 CXR>> Bilateral opacification, worsening at L mid lung 1/31 AXR>> Gas-filled prominent transverse and ascending colon, likely ileus 1/31 CXR>> increased opacities in RUL and LLL, possible necrotizing PNA   SIGNIFICANT EVENTS: 11/23>> Admit to [facility] with AMS, intubated, Also PEA arrest  12/02>> Tx to [facility] for rehab, on phenobarbitol  12/10>> respiratory and bradycardic arrest, intubated. CT head negative  12/14>>planned extubation  n. Failed with reintubation 12/15  12/16>> trached DF>>1/13 PCCM re contacted for decannulation  12/17>> liberated from vent  12/14>> PEG-IR 1/27>>sent to [facility] from [facility] for respiratory distress, fever, tachycardia 1/29>> Code status changed to DNR  SUBJECTIVE:  Required increase in sedating medication overnight No change in FiO2 or PEEP needs No change in pressor needs  VITAL SIGNS: Temp:  [99.4 F (37.4 C)-101 F (38.3 C)] 100.9 F (38.3 C) (02/03 1400) Pulse Rate:  [95-164] 136 (02/03 1400) Resp:  [19-39] 25 (02/03 1400) BP: (85-153)/(42-91) 136/67 mmHg (02/03  1400) SpO2:  [91 %-100 %] 94 % (02/03 1400) FiO2 (%):  [40 %-50 %] 50 % (02/03 1400) Weight:  [69.9 kg (154 lb 1.6 oz)] 69.9 kg (154 lb 1.6 oz) (02/03 0438) HEMODYNAMICS:   VENTILATOR SETTINGS: Vent Mode:  [-] PRVC FiO2 (%):  [40 %-50 %] 50 % Set Rate:  [14 bmp] 14 bmp Vt Set:  [490 mL] 490 mL PEEP:  [8 cmH20-10 cmH20] 10 cmH20 Plateau Pressure:  [20 cmH20-25 cmH20] 23 cmH20 INTAKE / OUTPUT:  Intake/Output Summary (Last 24 hours) at 08/09/14 1503 Last data filed at 08/09/14 1400  Gross per 24 hour  Intake 1777.67 ml  Output   1870 ml  Net -92.33 ml    PHYSICAL EXAMINATION: General:  Sedated on vent. NAD Neuro:  Responds to noxious stimuli, PERRL HEENT:  [place]/AT, PERRL, ETT in place  Cardiovascular:  tachycardic, no m/g/r Lungs: rhonchi bilaterally Abdomen: BS+, soft, non-distended. PEG tube in place Musculoskeletal: no edema  Skin: Peg site clean.   LABS:  CBC  Recent Labs Lab 08/06/14 0430 08/07/14 0440 08/09/14 0245  WBC 2.6* 7.9 5.4  HGB 8.6* 8.1* 8.1*  HCT 28.1* 25.6* 25.2*  PLT 71* 91* 102*   Coag's No results for input(s): APTT, INR in the last 168 hours. BMET  Recent Labs Lab 08/07/14 0440 08/08/14 0230 08/09/14 0245  NA 140 141 141  K 4.3 4.1 3.7  CL 107 108 104  CO2 30 30 34*  BUN CREATININE 0.65 0.60 0.53  GLUCOSE 95 89 94   Electrolytes  Recent Labs Lab 07/13/2014 2043  08/04/14 0330  08/07/14 0440 08/08/14 0230 08/09/14 0245  CALCIUM 9.0  < >  8.6  < > 9.4 9.6 9.4  MG 1.8  --  1.7  --   --  1.6 1.5  PHOS 3.6  --  1.6*  --   --  2.7  --   < > = values in this interval not displayed. Sepsis Markers  Recent Labs Lab 08/01/2014 2043 08/03/14 0139 08/04/14 0330  LATICACIDVEN 1.7  --   --   PROCALCITON 32.53 26.10 16.82   ABG  Recent Labs Lab 07/19/2014 2017 08/03/14 0421 08/03/14 2023  PHART 7.381 7.290* 7.306*  PCO2ART 38.9 39.8 41.4  PO2ART 105.0* 116.0* 77.0*   Liver Enzymes  Recent Labs Lab 07/29/2014 2043  08/07/14 0440  AST 51* 55*  ALT 32 22  ALKPHOS 114 119*  BILITOT 1.2 1.2  ALBUMIN 2.9* 1.6*   Cardiac Enzymes  Recent Labs Lab 07/18/2014 2043 08/03/14 0139 08/03/14 0740  TROPONINI 0.07* 0.06* 0.05*   Glucose  Recent Labs Lab 08/08/14 1556 08/08/14 1926 08/09/14 0014 08/09/14 0422 08/09/14 0815 08/09/14 1204  GLUCAP 81 94 97 87 84 87    CXR: NNF  ASSESSMENT / PLAN: PULMONARY H/o trach 12/16>>1/13 OETT 1/27 >>  A: Acute hypoxic Respiratory failure Aspiration HCAP- concern for necrotizing PNA based on CXR 1/31 P:   Cont vent support and attempt to wean PEEP and FiO2 although we have made no headway Cont vent bundle Daily SBT if/when meets criteria, although unable at this time Continue abx as below  CARDIOVASCULAR CVL Left IJ 1/27>> A:  Minimally elevated troponin does not warrant eval presently H/o HTN Septic shock H/O CHF H/o cardiopulmonary arrest 05/2014  Tachycardia  P:   Hold Metoprolol given increasing pressor needs PRN metoprolol to maintain HR < 140 Cont phenylephrine to maintain MAP > 60 mmHg  RENAL A:   Lactic acidosis- resolved Hypokalemia Hypophosphatemia Hypomagnesemia  P:   Monitor BMET intermittently Monitor I/Os Correct electrolytes as indicated  GASTROINTESTINAL A:   Cirrhosis, portal HTN, ascites likely 2/2 alcohol abuse  H/o C diff 06/01/2014  Transaminitis - mild  G tube status P:   SUP: enteral famotidine Cont holding TFs  HEMATOLOGIC A:   Anemia without acute bleeding Mild thrombocytopenia P:  DVT px: LMWH Monitor CBC intermittently  INFECTIOUS A:   Severe sepsis Elevated PCT HCAP- concern for necrotizing PNA UTI P:   Resp 1/27 >> MRSA and pseudomonas (sens) Blood 1/27 >> 1 of 2 coag neg staph UTI 1/27 >> multiple morphotypes   Vancomycin 1/27>> Zosyn 1/27>>   ENDOCRINE A:   Mild hyperglycemia P:   Monitor glu on chem panels Consider SSI for glu > 180   NEUROLOGIC A:   Acute on chronic  encephalopathy  H/o Alcohol abuse with h/o DTs, h/o heroin abuse  H/o Severe anoxic brain injury  H/o stroke  P:   RASS goal: -1 Continue Propofol gtt Continue Fentanyl prn  Continue Methadone 5 mg bid  Decrease Klonopin 1 mg Q12H per peg Continue Seroquel 50 mg BID   FAMILY  - Updates: updated father 1/27. Talked with wife and daughter on 1/28 in regards to patient's baseline and code status. Family notes he is not verbal, but will make attempts to talk and they believe he understands when they are talking to him. He is not able to care for himself in regards to daily tasks of living. Wife and daughter do not know what the patient's wishes were in the event he would need CPR/ACLS. Family does note he would not want to be kept  on a ventilator long term. I expressed to the family to think more about what his wishes would be and to come this morning to discuss goals of care further. Family agreed. On 1/29 family agreed to make patient DNR and have opted to continue full support for the next 48 hours. 1/31- Family wishes to continue full support for the next few days to allow other family members to visit, finalize plans. Family aware of poor prognosis given worsening PNA.  - Father updated by Dr Delton CoombesByrum 2/1. We have agreed to watch for improvement or decline for another 1-2 days. Depending on the trend we will decide regarding goals for care.   - Inter-disciplinary family meeting performed 2/3 with Dr Delton CoombesByrum and Aggie Cosierrystal RN present. Explained failure to improve, poor prognosis for meaningful improvement to wife, father and other family at bedside. Unfortunately he will likely not recover. They understand this and do not want to prolong suffering. We have agreed that it would be appropriate to transition to comfort care. We will do this on 2/4 when the family has had an opportunity to come to the hospital. Confirmed his DNR status.    Independent CC time 45 minutes  Levy Pupaobert Dakisha Schoof, MD, PhD 08/09/2014,  3:03 PM [facility] [phone] or if no answer [phone]

## 2014-08-10 LAB — GLUCOSE, CAPILLARY
GLUCOSE-CAPILLARY: 80 mg/dL (ref 70–99)
GLUCOSE-CAPILLARY: 82 mg/dL (ref 70–99)
Glucose-Capillary: 75 mg/dL (ref 70–99)

## 2014-08-10 MED ORDER — LORAZEPAM 2 MG/ML IJ SOLN
2.0000 mg | INTRAMUSCULAR | Status: DC | PRN
  Administered 2014-08-10 (×2): 2 mg via INTRAVENOUS
  Filled 2014-08-10 (×2): qty 1

## 2014-08-10 MED ORDER — IBUPROFEN 600 MG PO TABS
600.0000 mg | ORAL_TABLET | ORAL | Status: AC
  Administered 2014-08-10: 600 mg via NASOGASTRIC
  Filled 2014-08-10: qty 1
  Filled 2014-08-10: qty 3

## 2014-08-10 MED ORDER — MORPHINE BOLUS VIA INFUSION
5.0000 mg | INTRAVENOUS | Status: DC | PRN
  Administered 2014-08-10: 10 mg via INTRAVENOUS
  Administered 2014-08-10: 8 mg via INTRAVENOUS
  Administered 2014-08-10: 5 mg via INTRAVENOUS
  Administered 2014-08-10: 10 mg via INTRAVENOUS
  Filled 2014-08-10 (×5): qty 20

## 2014-08-10 MED ORDER — MORPHINE SULFATE 25 MG/ML IV SOLN
2.0000 mg/h | INTRAVENOUS | Status: DC
  Administered 2014-08-10: 15 mg/h via INTRAVENOUS
  Filled 2014-08-10: qty 10

## 2014-09-05 NOTE — Progress Notes (Signed)
PULMONARY / CRITICAL CARE MEDICINE   Name: Paul Scott MRN: [id number] DOB: 1965/08/15    ADMISSION DATE:  2014/08/07 CONSULTATION DATE:  August 07, 2014   REFERRING MD :  Duke Salvia   CHIEF COMPLAINT:  Respiratory distress   INITIAL PRESENTATION:  49 y.o pmh polysubstance, cirrhosis, portal HTN, stroke, chronic PEG with complicated hospital course recently (see below).  He presents from [facility] to [facility] with severe respiratory distress with hypoxia (77% on room air and 89% on NRB), accessory muscle use, rales,  fever (104), tachycardic to 102.  BP 110/54.  Failed NRB needed intubation transferred to [facility] for further care.   STUDIES:  1/27 CXR>> negative 1/28 CT Angio Chest>> Neg for PE. Multifocal nodular areas of ground glass infiltration bilaterally. Multifocal PNA vs. Atypical PNA (fungal, TB, septic emboli).  1/28 CXR>> Bilateral opacification, worsening at L mid lung 1/31 AXR>> Gas-filled prominent transverse and ascending colon, likely ileus 1/31 CXR>> increased opacities in RUL and LLL, possible necrotizing PNA   SIGNIFICANT EVENTS: 11/23>> Admit to [facility] with AMS, intubated, Also PEA arrest  12/02>> Tx to [facility] for rehab, on phenobarbitol  12/10>> respiratory and bradycardic arrest, intubated. CT head negative  12/14>>planned extubation  n. Failed with reintubation 12/15  12/16>> trached DF>>1/13 PCCM re contacted for decannulation  12/17>> liberated from vent  12/14>> PEG-IR 1/27>>sent to [facility] from [facility] for respiratory distress, fever, tachycardia 1/29>> Code status changed to DNR  SUBJECTIVE:  No evidence discomfort No clinical changes overnight  VITAL SIGNS: Temp:  [99.3 F (37.4 C)-101.3 F (38.5 C)] 100.6 F (38.1 C) (02/04 0700) Pulse Rate:  [98-147] 120 (02/04 0700) Resp:  [19-30] 23 (02/04 0700) BP: (101-145)/(50-86) 114/55 mmHg (02/04 0700) SpO2:  [91 %-100 %] 96 % (02/04 0813) FiO2 (%):  [50  %] 50 % (02/04 0813) Weight:  [70 kg (154 lb 5.2 oz)] 70 kg (154 lb 5.2 oz) (02/04 0500) HEMODYNAMICS:   VENTILATOR SETTINGS: Vent Mode:  [-] PRVC FiO2 (%):  [50 %] 50 % Set Rate:  [14 bmp] 14 bmp Vt Set:  [490 mL] 490 mL PEEP:  [10 cmH20] 10 cmH20 Plateau Pressure:  [22 cmH20-24 cmH20] 23 cmH20 INTAKE / OUTPUT:  Intake/Output Summary (Last 24 hours) at 08/12/2014 0819 Last data filed at 08/31/2014 0700  Gross per 24 hour  Intake 1365.76 ml  Output    820 ml  Net 545.76 ml    PHYSICAL EXAMINATION: General:  Sedated on vent. NAD Neuro:  Responds to noxious stimuli, PERRL HEENT:  [place]/AT, PERRL, ETT in place  Cardiovascular:  tachycardic, no m/g/r Lungs: rhonchi bilaterally Abdomen: BS+, soft, non-distended. PEG tube in place Musculoskeletal: no edema  Skin: Peg site clean.   LABS:  CBC  Recent Labs Lab 08/06/14 0430 08/07/14 0440 08/09/14 0245  WBC 2.6* 7.9 5.4  HGB 8.6* 8.1* 8.1*  HCT 28.1* 25.6* 25.2*  PLT 71* 91* 102*   Coag's No results for input(s): APTT, INR in the last 168 hours. BMET  Recent Labs Lab 08/07/14 0440 08/08/14 0230 08/09/14 0245  NA 140 141 141  K 4.3 4.1 3.7  CL 107 108 104  CO2 30 30 34*  BUN CREATININE 0.65 0.60 0.53  GLUCOSE 95 89 94   Electrolytes  Recent Labs Lab 08/04/14 0330  08/07/14 0440 08/08/14 0230 08/09/14 0245  CALCIUM 8.6  < > 9.4 9.6 9.4  MG 1.7  --   --  1.6 1.5  PHOS  1.6*  --   --  2.7  --   < > = values in this interval not displayed. Sepsis Markers  Recent Labs Lab 08/04/14 0330  PROCALCITON 16.82   ABG  Recent Labs Lab 08/03/14 2023  PHART 7.306*  PCO2ART 41.4  PO2ART 77.0*   Liver Enzymes  Recent Labs Lab 08/07/14 0440  AST 55*  ALT 22  ALKPHOS 119*  BILITOT 1.2  ALBUMIN 1.6*   Cardiac Enzymes No results for input(s): TROPONINI, PROBNP in the last 168 hours. Glucose  Recent Labs Lab 08/09/14 0014 08/09/14 0422 08/09/14 0815 08/09/14 1204 08/09/14 1559  08/09/14 1945  GLUCAP 97 87 84 87 82 82    CXR: NNF  ASSESSMENT / PLAN: PULMONARY H/o trach 12/16>>1/13 OETT 1/27 >>  A: Acute hypoxic Respiratory failure Aspiration HCAP- concern for necrotizing PNA based on CXR 1/31 P:   Planning for withdrawal of care today 2/4 at 10:00   CARDIOVASCULAR CVL Left IJ 1/27>> A:  Minimally elevated troponin does not warrant eval presently H/o HTN Septic shock H/O CHF H/o cardiopulmonary arrest 05/2014  Tachycardia  P:   Hold Metoprolol given increasing pressor needs PRN metoprolol to maintain HR < 140 Cont phenylephrine to maintain MAP > 60 mmHg; will consider titration to off depending on how he does post-extubation  RENAL A:   Lactic acidosis- resolved Hypokalemia Hypophosphatemia Hypomagnesemia  P:   No more labs  GASTROINTESTINAL A:   Cirrhosis, portal HTN, ascites likely 2/2 alcohol abuse  H/o C diff 06/01/2014  Transaminitis - mild  G tube status P:   D/c all feeds and meds  HEMATOLOGIC A:   Anemia without acute bleeding Mild thrombocytopenia P:  D/c LMWH  INFECTIOUS A:   Severe sepsis Elevated PCT HCAP- concern for necrotizing PNA UTI P:   Resp 1/27 >> MRSA and pseudomonas (sens) Blood 1/27 >> 1 of 2 coag neg staph UTI 1/27 >> multiple morphotypes   Vancomycin 1/27>> 2/3 Zosyn 1/27>> 2/3   ENDOCRINE A:   Mild hyperglycemia P:   Monitor glu on chem panels Consider SSI for glu > 180   NEUROLOGIC A:   Acute on chronic encephalopathy  H/o Alcohol abuse with h/o DTs, h/o heroin abuse  H/o Severe anoxic brain injury  H/o stroke  P:   RASS goal: -1 Continue Propofol gtt Continue Fentanyl prn  Continue Methadone 5 mg bid  Decrease Klonopin 1 mg Q12H per peg Continue Seroquel 50 mg BID   FAMILY  - Updates: updated father 1/27. Talked with wife and daughter on 1/28 in regards to patient's baseline and code status. Family notes he is not verbal, but will make attempts to talk and they believe he  understands when they are talking to him. He is not able to care for himself in regards to daily tasks of living. Wife and daughter do not know what the patient's wishes were in the event he would need CPR/ACLS. Family does note he would not want to be kept on a ventilator long term. I expressed to the family to think more about what his wishes would be and to come this morning to discuss goals of care further. Family agreed. On 1/29 family agreed to make patient DNR and have opted to continue full support for the next 48 hours. 1/31- Family wishes to continue full support for the next few days to allow other family members to visit, finalize plans. Family aware of poor prognosis given worsening PNA.  - Father updated by Dr  Colum Colt 2/1. We have agreed to watch for improvement or decline for another 1-2 days. Depending on the trend we will decide regarding goals for care.   - Inter-disciplinary family meeting performed 2/3 with Dr Delton Coombes and Aggie Cosier RN present. Explained failure to improve, poor prognosis for meaningful improvement to wife, father and other family at bedside. Unfortunately he will likely not recover. They understand this and do not want to prolong suffering. We have agreed that it would be appropriate to transition to comfort care. We will do this on 2/4 when the family has had an opportunity to come to the hospital. Confirmed his DNR status.   - Planning for withdrawal of care 2/4 at 10:00 per family wishes.    Levy Pupa, MD, PhD August 28, 2014, 8:19 AM [facility] [phone] or if no answer [phone]

## 2014-09-05 NOTE — Progress Notes (Signed)
Chaplain responded to referral from RN.  Chaplain made introduction, pt's spouse and mother in law at bedside, numerous other family in room.  Pt's brother and spouse present.  RN reports family has had pastoral support.  Comfort cart in place outside room.  Family states they are "ok" for now.  Chaplain provided emotional support and will follow up if needed.    08/09/2014 1100  Clinical Encounter Type  Visited With Patient and family together;Health care provider  Visit Type Initial;Spiritual support;Critical Care;Patient actively dying  Referral From Nurse  Spiritual Encounters  Spiritual Needs Emotional;Grief support  Stress Factors  Patient Stress Factors Exhausted;Health changes  Family Stress Factors Exhausted;Loss   Blain PaisOvercash, Jaryiah Mehlman A, Chaplain 08/28/2014 11:23 AM

## 2014-09-05 NOTE — Progress Notes (Signed)
80ml fentanyl infusion wasted Lamount CrankerKelli Hunt RN witnessed.  Jacqulyn Canehristopher Scott Sarabelle Genson RN, BSN, CCRN

## 2014-09-05 NOTE — Progress Notes (Signed)
  NUTRITION FOLLOW UP  Chart reviewed, pt transitioning to comfort care.  No further nutrition interventions at this time.  Please re-consult as needed.  Magdalen SpatzLauren Takiyah Bohnsack MS Dietetic Intern Pager Number [phone]

## 2014-09-05 NOTE — Progress Notes (Signed)
13:23 Patient asystole on unit monitor.  Assessment at bedside no auscultation of heart sounds or breath sounds and pupils non-reactive to light.  Assessment findings confirmed by second RN Toniann FailLauren Cline. Dr. Delton CoombesByrum made aware, Family (wife, father, & daughter) at bedside.  Morphine infusion 115 ml wasted witnessed by lauren Alberteen Spindleline RN.  No valuable to return, family decline need for chaplin.  CDS notified.   Jacqulyn Canehristopher Scott Loriann Bosserman RN, BSN, CCRN

## 2014-09-05 NOTE — Progress Notes (Signed)
UR Completed.  [phone]  

## 2014-09-05 NOTE — Procedures (Signed)
Extubation Procedure Note  Patient Details:   Name: [name] DOB: 10/31/1965 MRN: [id number]   Airway Documentation:  AIRWAYS 7.5 mm (Active)  Secured at (cm) 23 cm 06/19/2014 12:00 AM    Evaluation  O2 sats: stable throughout Complications: No apparent complications Patient did tolerate procedure well. Bilateral Breath Sounds: Clear Suctioning: Airway No  Pt terminally extubatd with family at bedisde  Melanee Spryelson, Lile Mccurley Lawson 09/03/2014, 12:44 PM

## 2014-09-05 DEATH — deceased

## 2014-09-26 NOTE — Progress Notes (Signed)
PULMONARY / CRITICAL CARE MEDICINE death summary  Name: Paul GellJohn Scott MRN: [id number] DOB: 04/04/1966    ADMISSION DATE:  07/22/2014 Date of death: 08/13/2014  CHIEF COMPLAINT:  Respiratory distress   FINAL CAUSE OF DEATH Septic shock  SECONDARY CAUSES OF DEATH Left lower lobe, right upper lobe healthcare associated pneumonia Ventilator-dependent acute on chronic respiratory failure Urinary tract infection Chronic dysphagia with PEG placement Shock liver Acute renal insufficiency Ileus Alcoholic cirrhosis Portal hypertension Chronic systolic and diastolic CHF Stress induced ST elevation MI Lactic acidosis Hypokalemia Hypophosphatemia Hypomagnesemia Hyperglycemia  Acute on chronic encephalopathy  H/o Alcohol abuse with h/o DTs, h/o heroin abuse  H/o stroke  History of cardiac arrest November 2015 H/o Severe anoxic brain injury  History of C. Difficile colitis November 2015    INITIAL PRESENTATION:  49 y.o pmh polysubstance, cirrhosis, portal HTN, stroke, history of complicated hospitalization in November and December 2015 with respiratory bradycardic arrest, chronic PEG placed for chronic aspiration. He was decannulated 07/19/14.  [facility] with severe respiratory distress with hypoxia (77% on room air and 89% on NRB), accessory muscle use, rales,  fever (104), tachycardic to 102.  BP 110/54.  Failed NRB [facility]. He was intubated and ventilated. Chest x-ray showed evidence for multifocal pneumonia with probable areas of left lower lobe necrosis. He was treated with broad-spectrum antibiotics for HCAP, as well as with phenylephrine for associated sepsis and septic shock. His course was complicated by sepsis associated acute renal failure, mild transaminitis consistent with shock liver superimposed on chronic liver disease, and acute on chronic metabolic encephalopathy. Despite all  aggressive support he failed to show any signs of improvement hemodynamically or in his mental status. Discussions were undertaken with his family and it was clear that he would not want to undergo prolonged critical care support and he did not have an opportunity back to his previous quality of life. The decision was made based on his prognosis to transition to comfort care on 08/18/2014. He died on that same day with family present.    Levy Pupaobert Cathalina Barcia, MD, PhD 09/26/2014, 3:29 PM [place] [facility] [phone] or if no answer [phone]

## 2014-09-26 NOTE — Discharge Summary (Signed)
PULMONARY / CRITICAL CARE MEDICINE death summary  Name:Ronal Geraldo PitterGoodwin ZOX:[id number] DOB:06/23/1966   ADMISSION DATE: 09-Oct-2014 Date of death: 08/22/2014  CHIEF COMPLAINT: Respiratory distress   FINAL CAUSE OF DEATH Septic shock  SECONDARY CAUSES OF DEATH Left lower lobe, right upper lobe healthcare associated pneumonia Ventilator-dependent acute on chronic respiratory failure Urinary tract infection Chronic dysphagia with PEG placement Shock liver Acute renal insufficiency Ileus Alcoholic cirrhosis Portal hypertension Chronic systolic and diastolic CHF Stress induced ST elevation MI Lactic acidosis Hypokalemia Hypophosphatemia Hypomagnesemia Hyperglycemia  Acute on chronic encephalopathy  H/o Alcohol abuse with h/o DTs, h/o heroin abuse  H/o stroke  History of cardiac arrest November 2015 H/o Severe anoxic brain injury  History of C. Difficile colitis November 2015    INITIAL PRESENTATION:  49 y.o pmh polysubstance, cirrhosis, portal HTN, stroke, history of complicated hospitalization in November and December 2015 with respiratory bradycardic arrest, chronic PEG placed for chronic aspiration. He was decannulated 07/19/14. [facility] with severe respiratory distress with hypoxia (77% on room air and 89% on NRB), accessory muscle use, rales, fever (104), tachycardic to 102. BP 110/54. Failed NRB [facility] He was intubated and ventilated. Chest x-ray showed evidence for multifocal pneumonia with probable areas of left lower lobe necrosis. He was treated with broad-spectrum antibiotics for HCAP, as well as with phenylephrine for associated sepsis and septic shock. His course was complicated by sepsis associated acute renal failure, mild transaminitis consistent with shock liver superimposed on chronic liver disease, and acute on chronic metabolic encephalopathy.  Despite all aggressive support he failed to show any signs of improvement hemodynamically or in his mental status. Discussions were undertaken with his family and it was clear that he would not want to undergo prolonged critical care support and he did not have an opportunity back to his previous quality of life. The decision was made based on his prognosis to transition to comfort care on 08/28/2014. He died on that same day with family present.   Levy Pupaobert Byrum, MD, PhD 09/26/2014, 3:46 PM [place] [facility] [phone] or if no answer [phone]

## 2016-02-05 IMAGING — CR DG CHEST 1V PORT
1 series · 1 of 1 positions shown · non-contrast
Comparison: 06/05/2014.

CLINICAL DATA: Pneumonia.

EXAM:
PORTABLE CHEST - 1 VIEW

[AP]
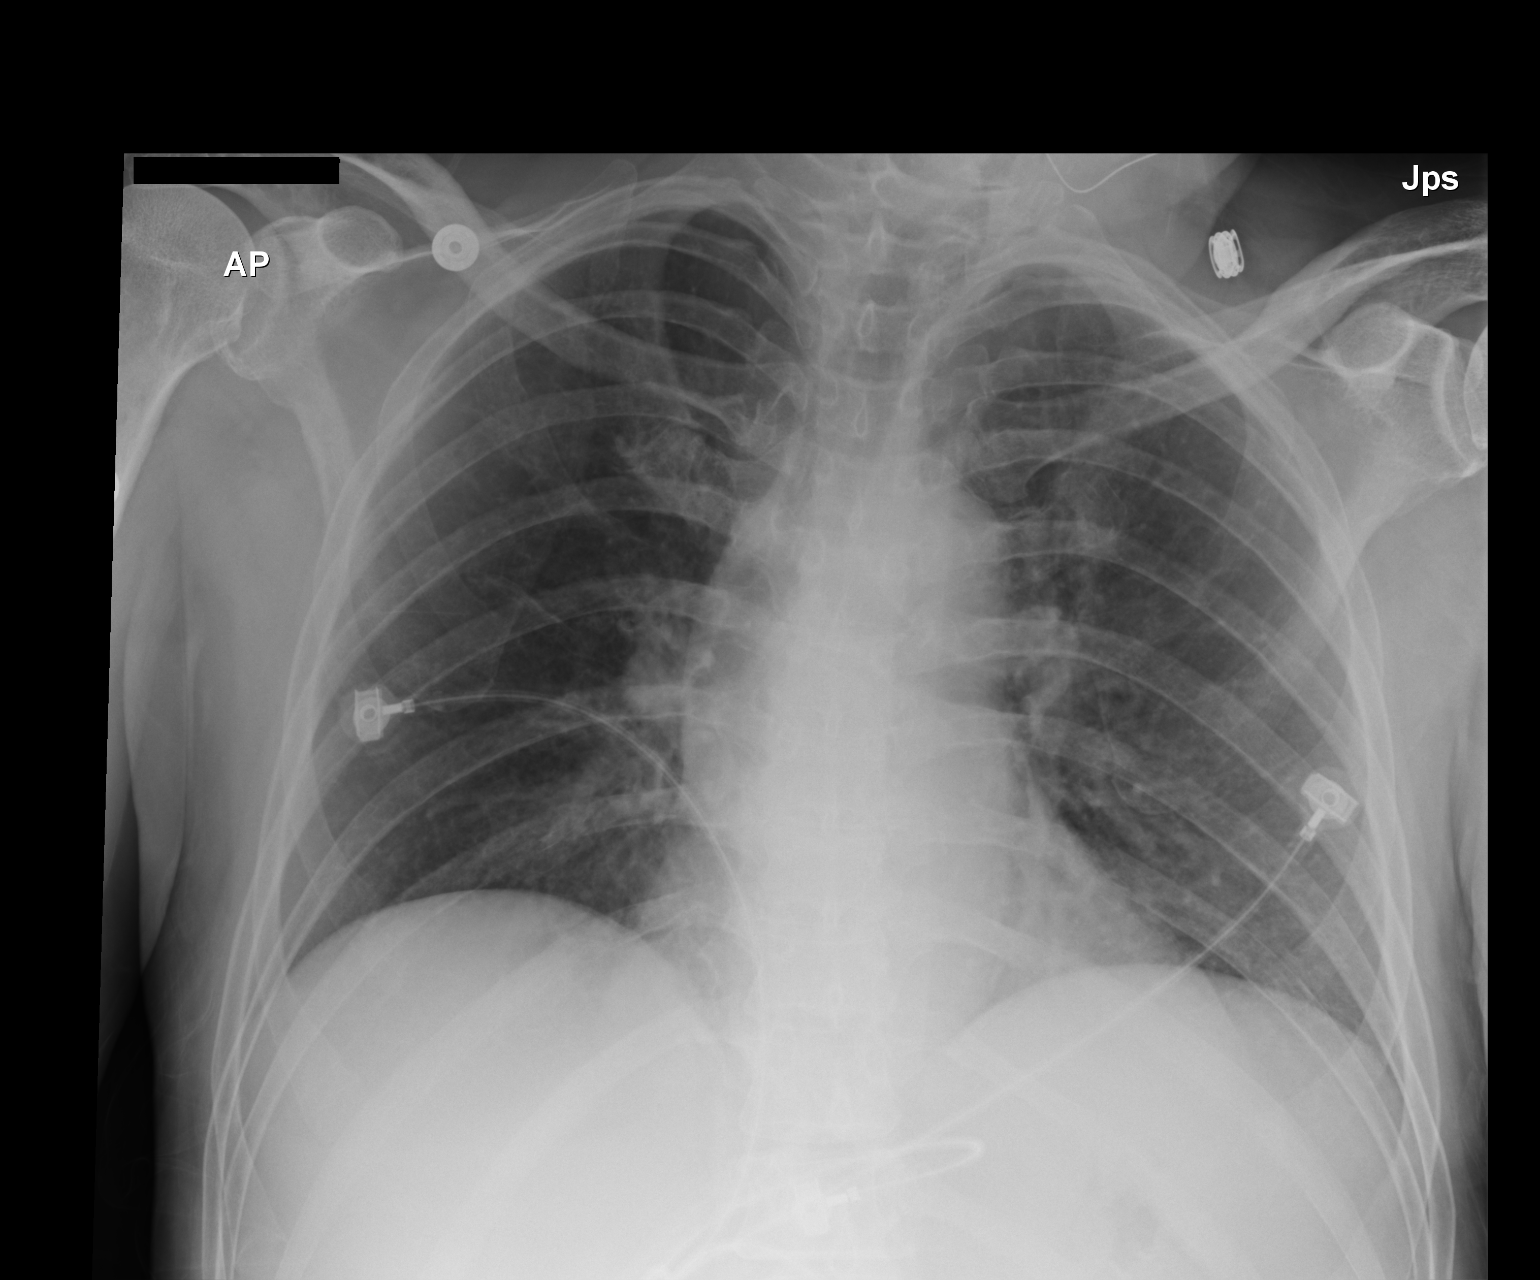

[1 of 1 positions shown; findings below may reference images not displayed]

FINDINGS: Mediastinal structures are normal. Heart size and pulmonary
vascularity normal. Stable mild left base subsegmental atelectasis.
No pleural effusion or pneumothorax. No acute osseous abnormality.
IMPRESSION: Stable mild left base subsegmental atelectasis.

## 2016-02-06 IMAGING — US US ABDOMEN COMPLETE
1 series · 13 of 25 positions shown · non-contrast
Comparison: Right upper quadrant abdominal ultrasound performed
11/26/2013, and CT of the abdomen and pelvis performed 02/21/2014

CLINICAL DATA: Known hepatic cirrhosis. Assess for ascites and
assess liver. Subsequent encounter.

EXAM:
ULTRASOUND ABDOMEN COMPLETE

[Series 1: us abdomen complete · 0.26mm/px · 13 of 45 slices shown]
[im 1/45]
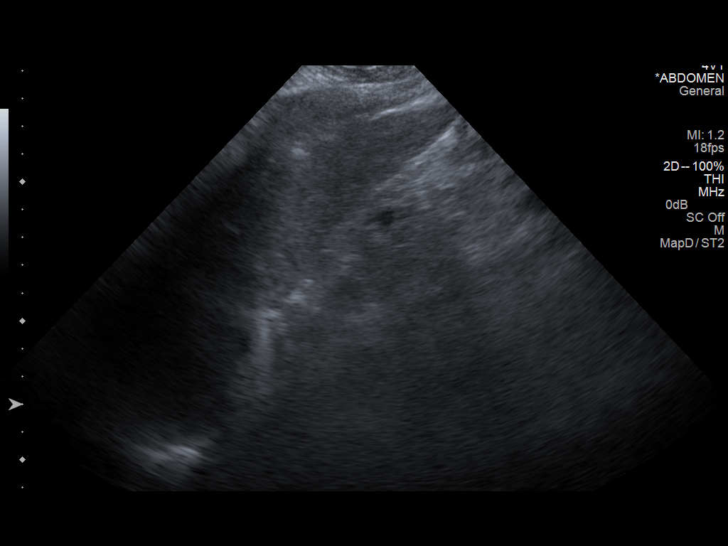
[im 4/45]
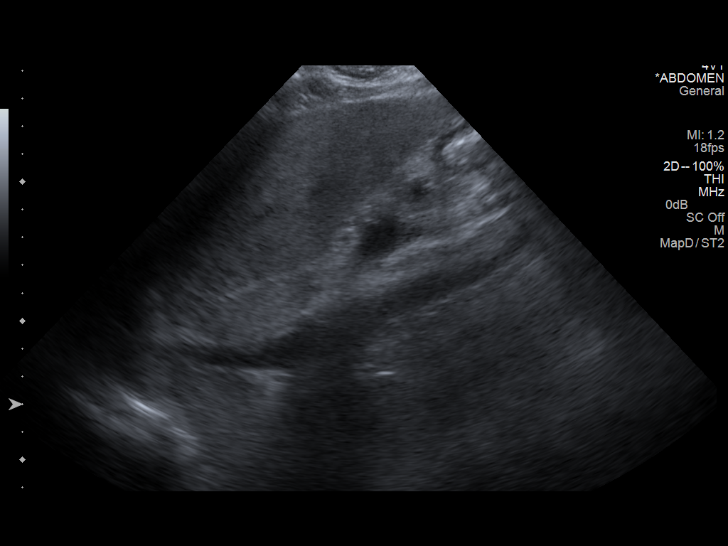
[im 8/45]
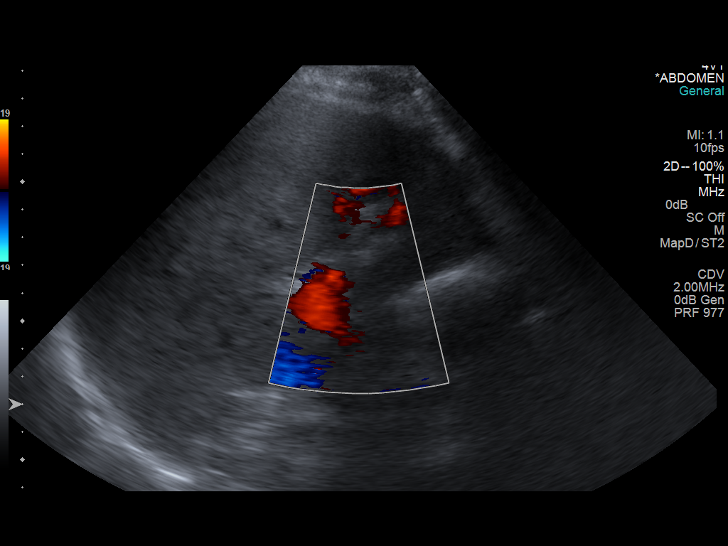
[im 12/45]
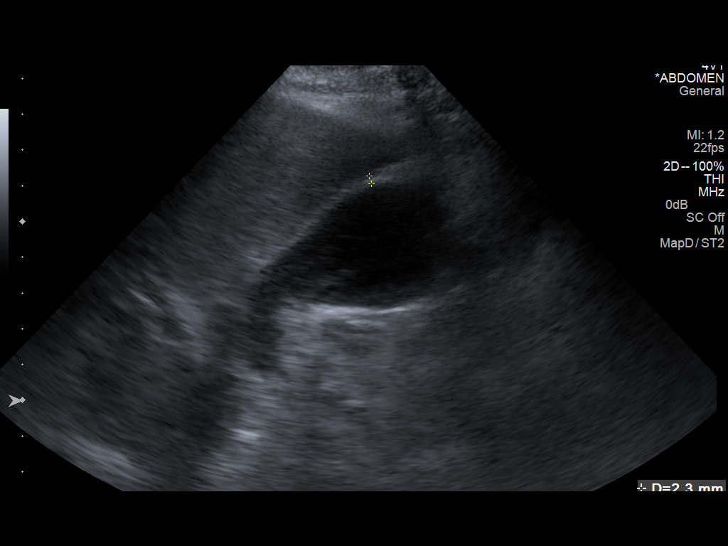
[im 15/45]
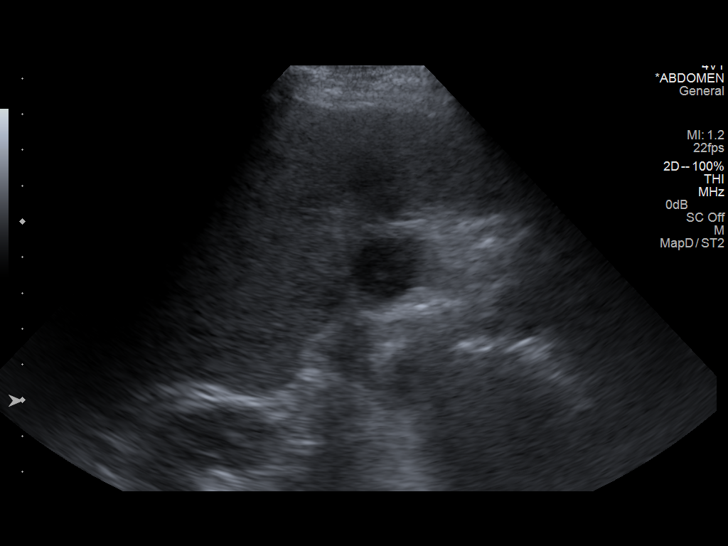
[im 19/45]
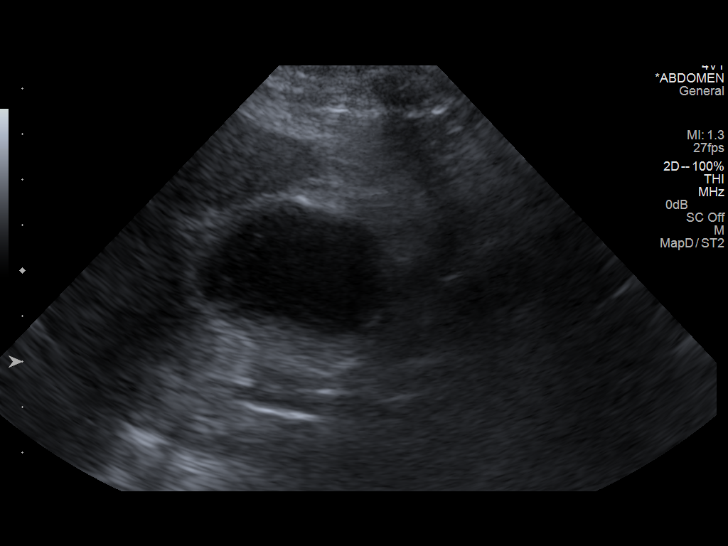
[im 23/45]
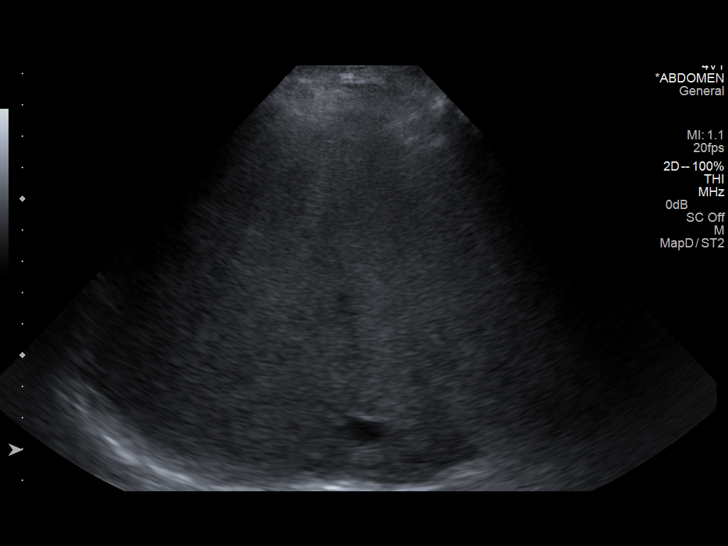
[im 26/45]
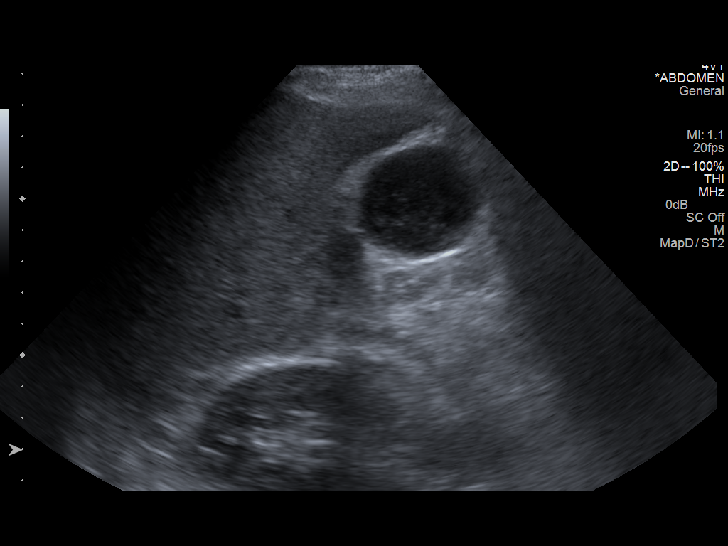
[im 30/45]
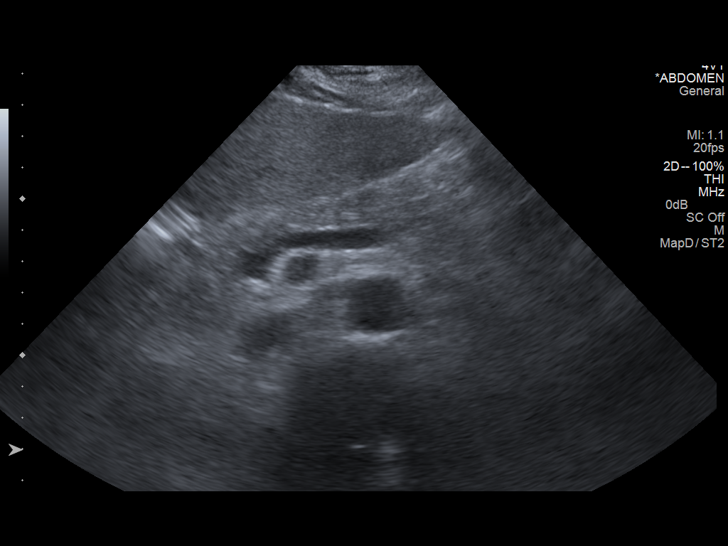
[im 34/45]
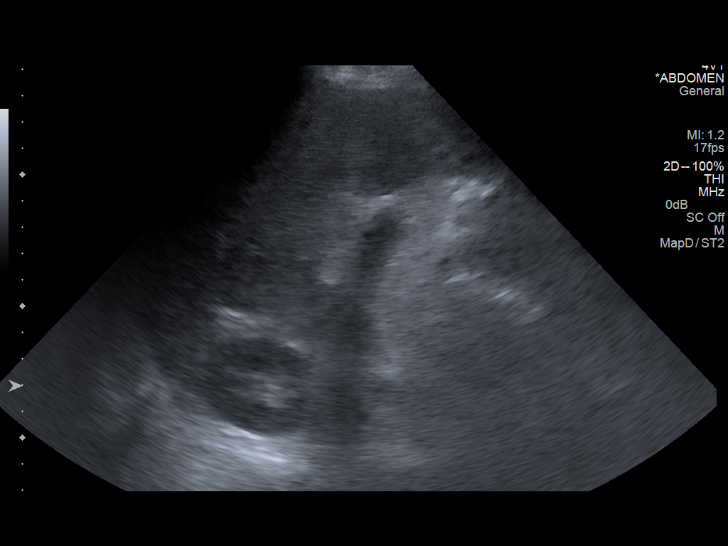
[im 37/45]
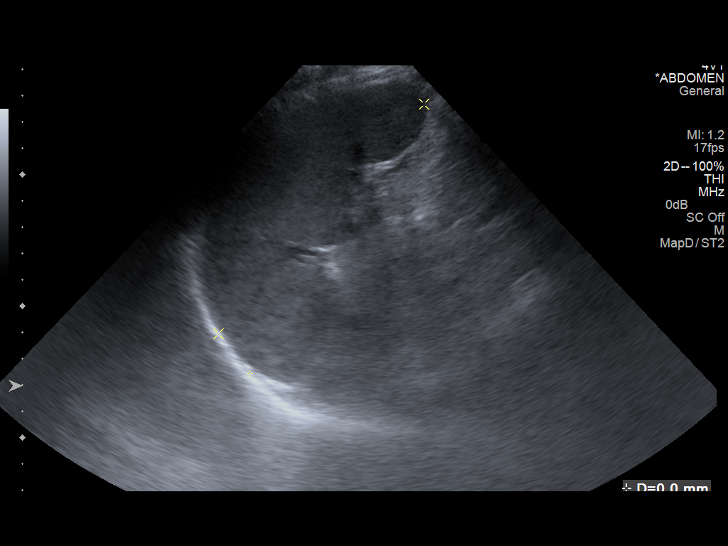
[im 41/45]
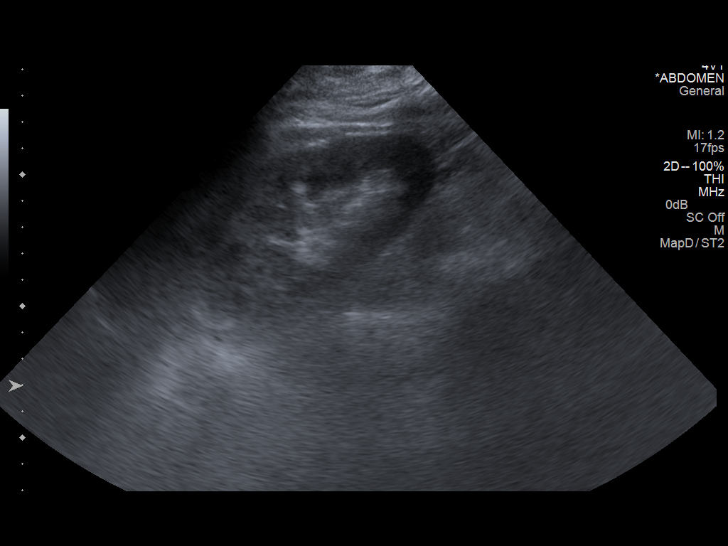
[im 45/45]
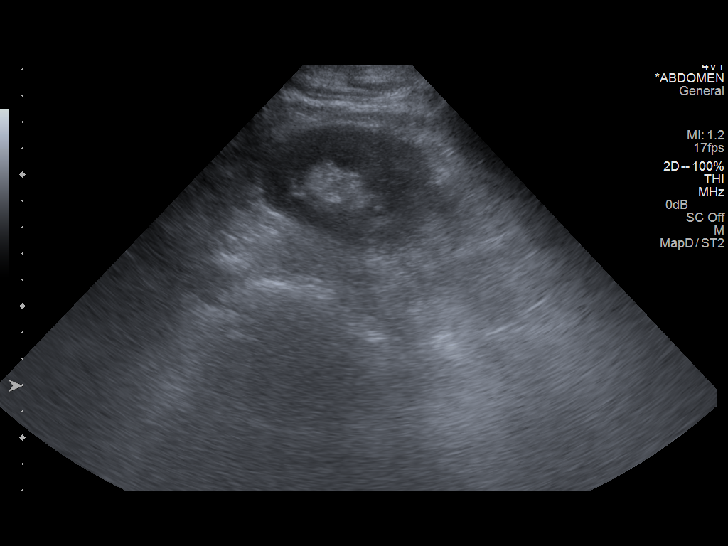

[13 of 25 positions shown; findings below may reference images not displayed]

FINDINGS: Gallbladder: Mildly echogenic sludge is noted within the
gallbladder. The gallbladder is otherwise unremarkable in
appearance. No significant gallbladder wall thickening or
pericholecystic fluid is seen. No ultrasonographic Murphy's sign is
elicited.

Common bile duct: Diameter: 0.5 cm, within normal limits in caliber

Liver: No focal lesion identified. Mildly increased echogenicity and
coarsened echotexture, compatible with fatty infiltration. There is
a nodular contour to the liver, reflecting known cirrhotic change.

IVC: No abnormality visualized.

Pancreas: Visualized portion unremarkable.

Spleen: Size and appearance within normal limits.

Right Kidney: Length: 11.0 cm. Echogenicity within normal limits. No
mass or hydronephrosis visualized.

Left Kidney: Length: 12.0 cm. Echogenicity within normal limits. No
mass or hydronephrosis visualized.

Abdominal aorta: No aneurysm visualized.

Other findings: None.
IMPRESSION: 1. No ascites seen.
2. Findings of hepatic cirrhosis again noted. Fatty infiltration
within the liver.
3. Mildly echogenic sludge noted in the gallbladder; gallbladder
otherwise unremarkable in appearance.

## 2016-02-09 IMAGING — CR DG CHEST 1V PORT
1 series · 1 of 1 positions shown · non-contrast
Comparison: Prior chest x-ray 06/08/2014

CLINICAL DATA: 48-year-old male with diagnosis of pneumonia

EXAM:
PORTABLE CHEST - 1 VIEW

[AP]
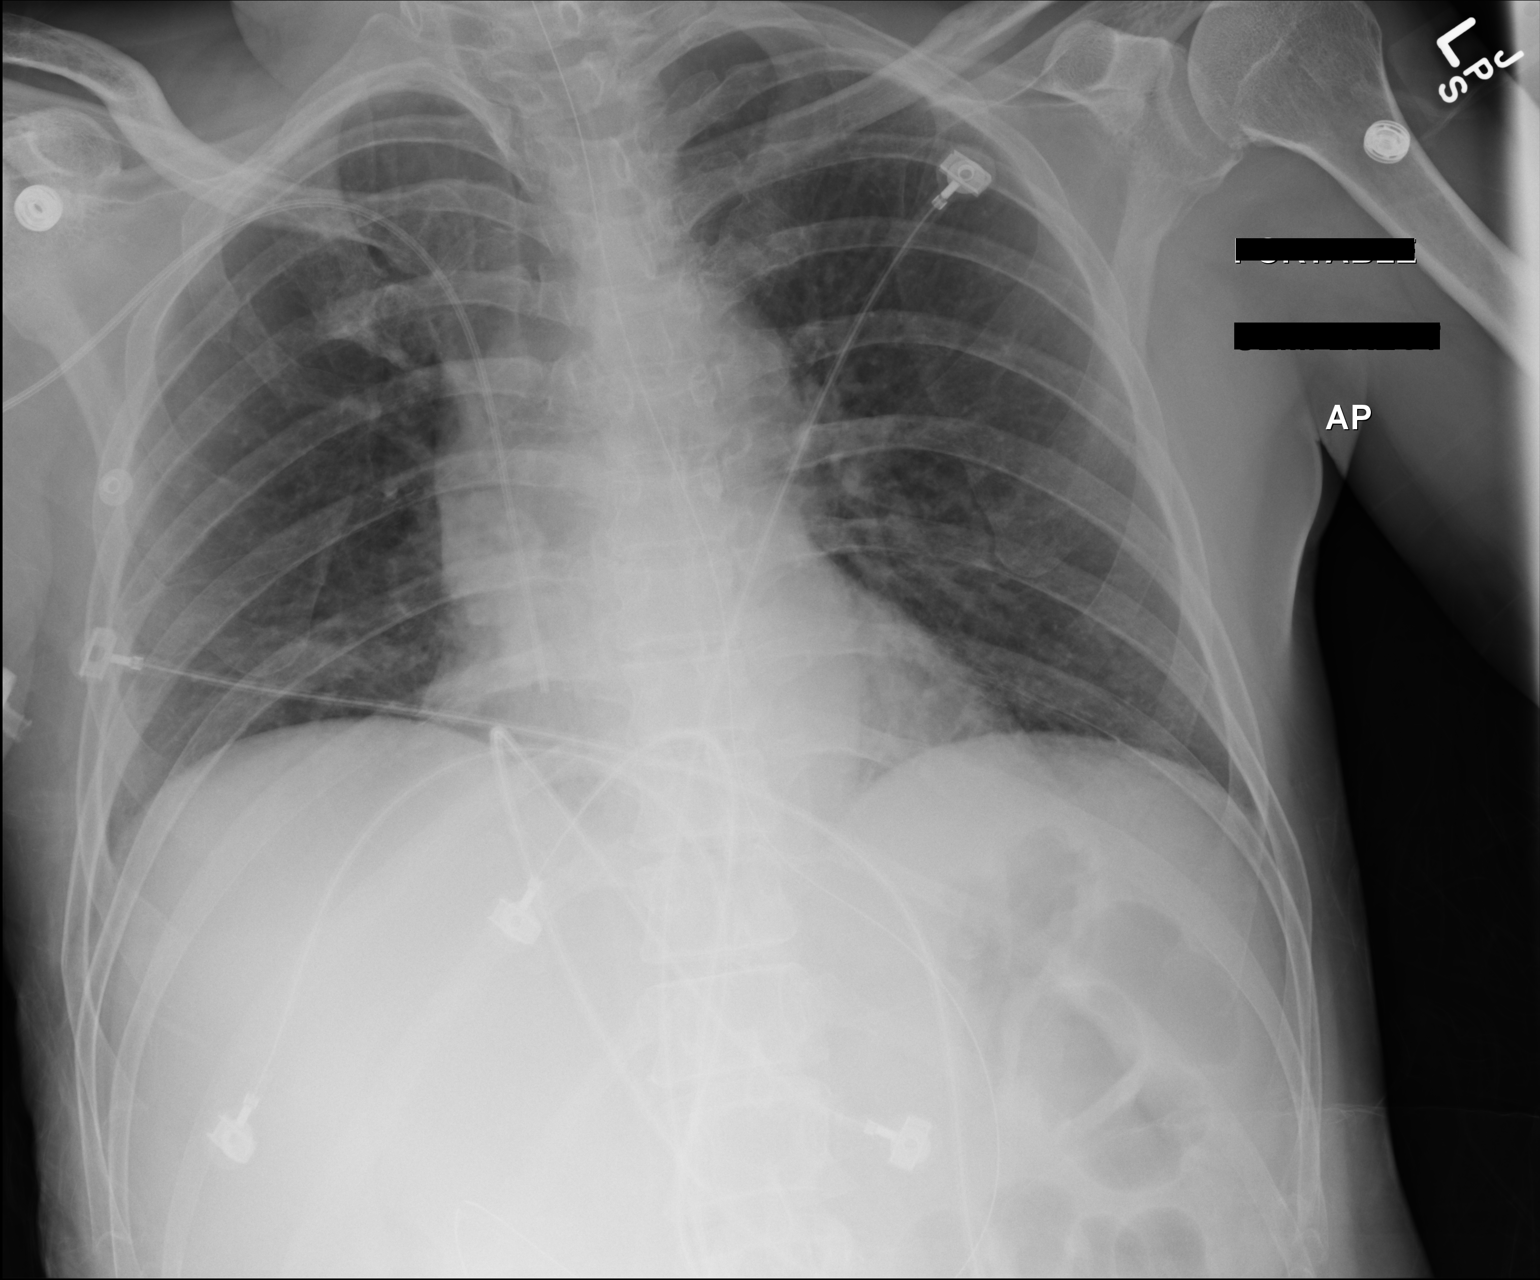

[1 of 1 positions shown; findings below may reference images not displayed]

FINDINGS: A nasogastric tube has been placed. The tip of the tube lies off the
field of view but can be identified in the region of the proximal
descending duodenum. There is a right upper extremity approach PICC.
The catheter tip is in good position at the superior cavoatrial
junction. Inspiratory volumes remain low and there is mild bibasilar
atelectasis. No focal infiltrate, pleural effusion or pneumothorax.
Cardiac and mediastinal contours remain unchanged. No acute osseous
abnormality.
IMPRESSION: 1. Low inspiratory volumes with bibasilar atelectasis.
2. Although incompletely imaged, the tip of the nasogastric tube can
be seen as far distally as the proximal duodenum.
3. Right upper extremity PICC remains in good position with the tip
overlying the superior cavoatrial junction.

## 2016-03-31 IMAGING — CR DG CHEST 1V PORT
1 series · 1 of 1 positions shown · non-contrast
Comparison: 08/02/2014

CLINICAL DATA: Respiratory failure

EXAM:
PORTABLE CHEST - 1 VIEW

[AP]
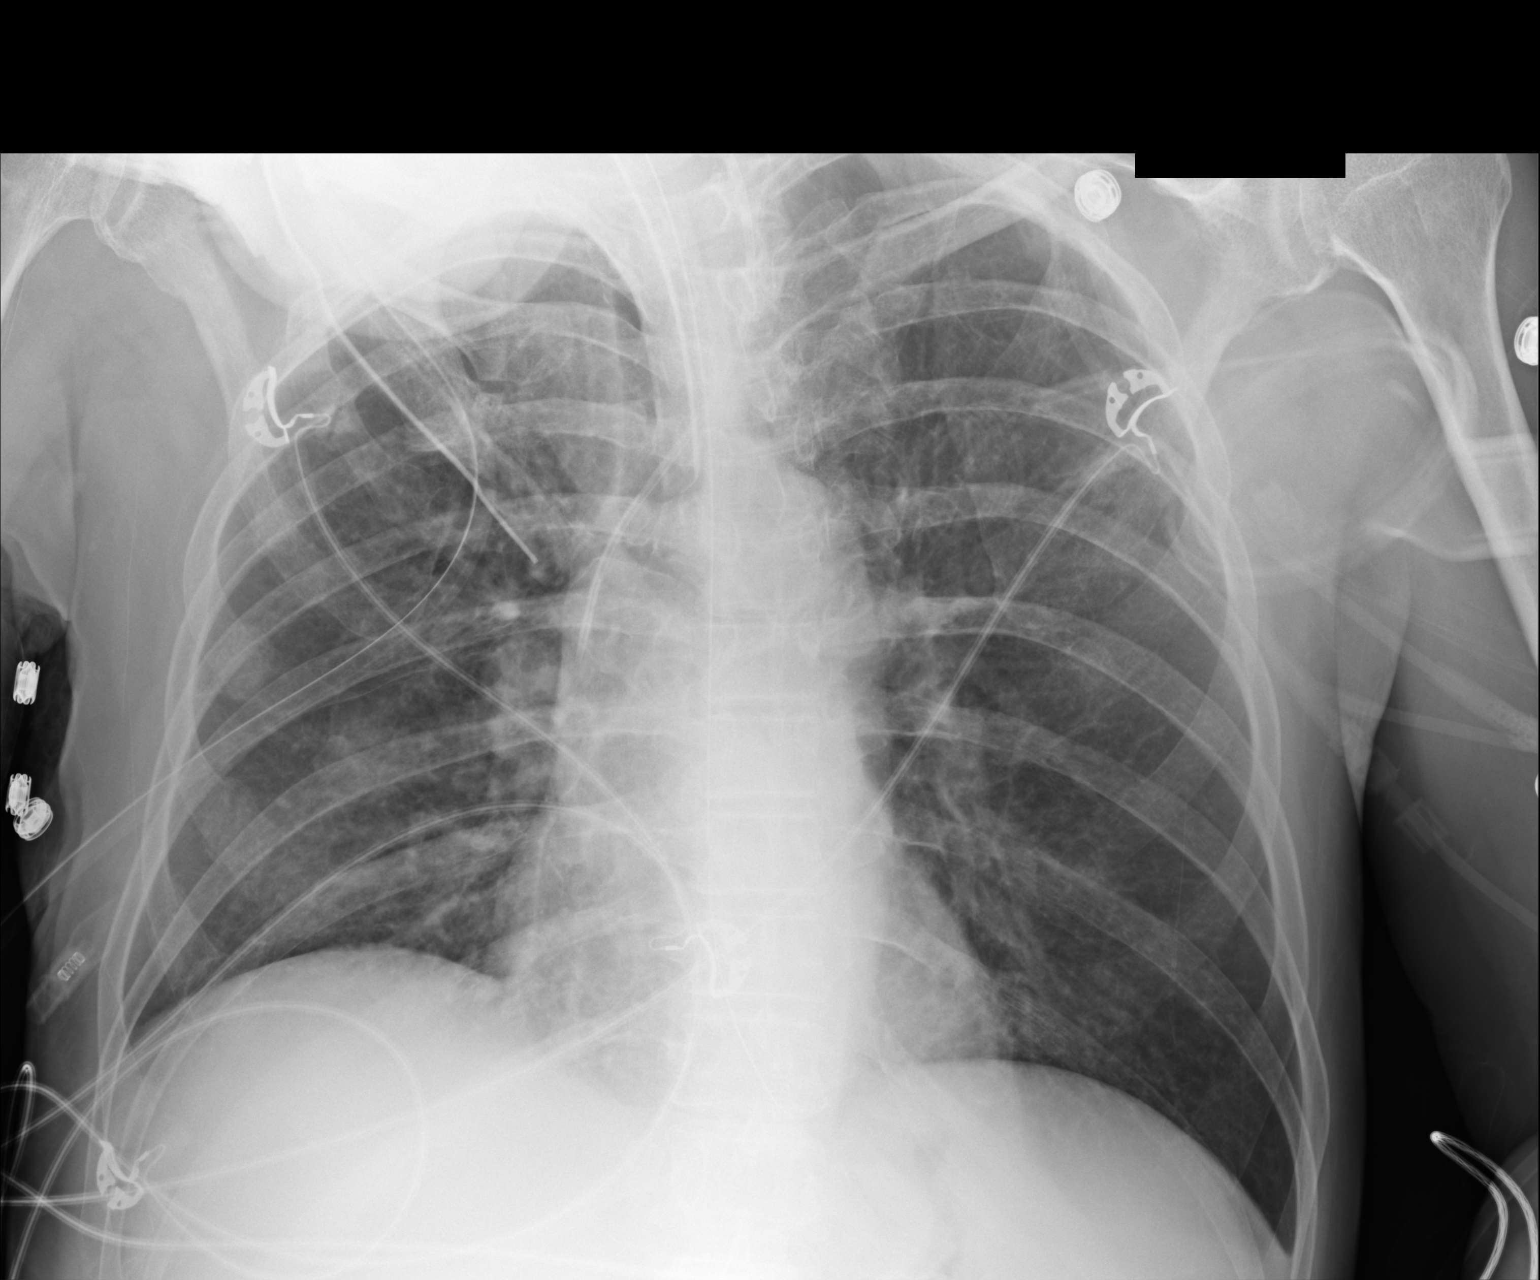

[1 of 1 positions shown; findings below may reference images not displayed]

FINDINGS: The endotracheal tube tip is above the carina. There is a left IJ
catheter with tip in the projection of the SVC. Heart size is
normal. There is no pleural effusion or airspace consolidation.
IMPRESSION: 1. Satisfactory position of support apparatus without complications.
2. Lungs remain clear.

## 2016-04-01 IMAGING — CR DG CHEST 1V PORT
1 series · 1 of 1 positions shown · non-contrast
Comparison: Chest radiograph from 08/02/2014, and CTA of the chest
performed earlier today at [DATE] a.m.

CLINICAL DATA: Acute onset of shortness of breath. Initial
encounter.

EXAM:
PORTABLE CHEST - 1 VIEW

[AP]
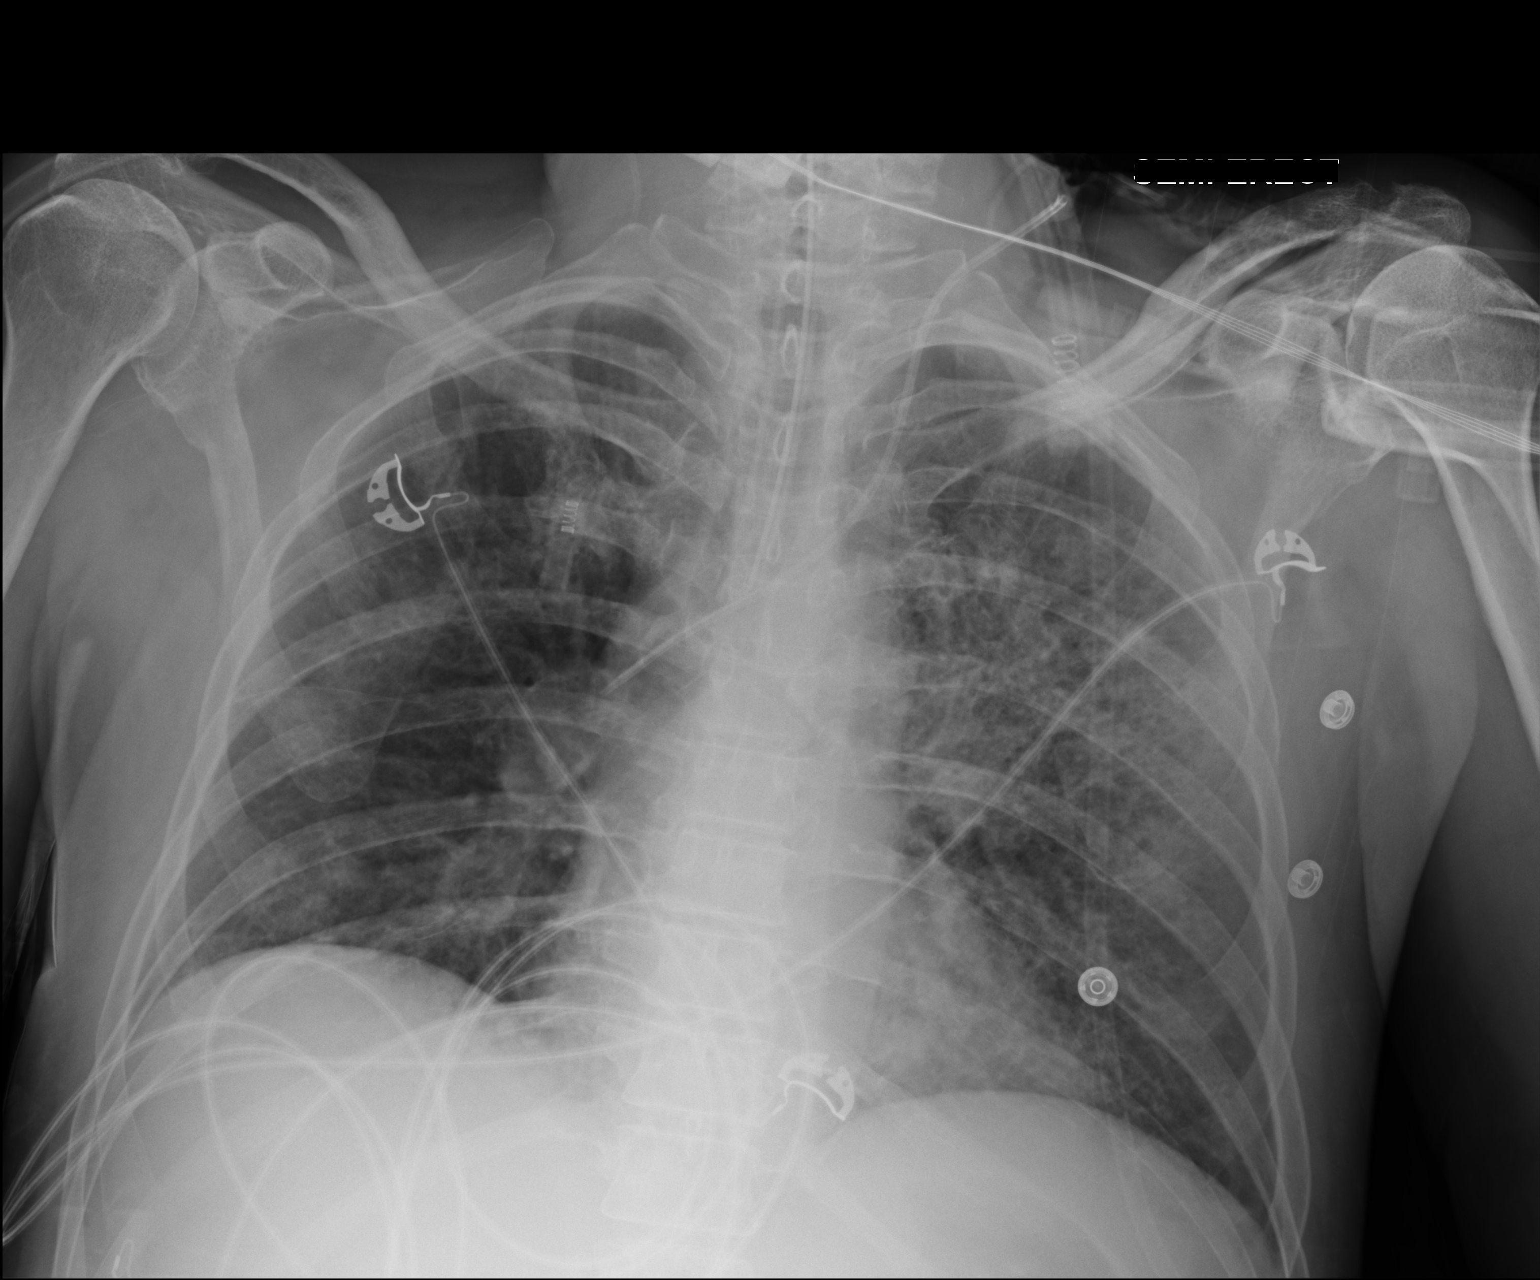

[1 of 1 positions shown; findings below may reference images not displayed]

FINDINGS: The patient's endotracheal tube is seen ending 2 cm above the
carina. A left IJ line is noted ending about the mid SVC.

There is worsening left mid lung zone airspace opacification,
concerning for worsening pneumonia. Underlying right-sided airspace
opacities are better characterized on prior studies. As noted on
recent CTA, this may be an atypical infection. No pleural effusion
or pneumothorax is seen.

The cardiomediastinal silhouette is normal in size. No acute osseous
abnormalities are identified.
IMPRESSION: Bilateral airspace opacification is worsening at the left mid lung
zone, concerning for worsening pneumonia. As noted on recent CTA,
this may reflect an atypical infection.
